# Patient Record
Sex: Male | Born: 1955 | Race: White | Hispanic: No | Marital: Married | Smoking: Former smoker
Health system: Southern US, Community
[De-identification: ages and names within clinical notes are randomized; demographics above are authoritative.]

## PROBLEM LIST (undated history)

## (undated) DIAGNOSIS — M1990 Unspecified osteoarthritis, unspecified site: Secondary | ICD-10-CM

## (undated) DIAGNOSIS — D1779 Benign lipomatous neoplasm of other sites: Secondary | ICD-10-CM

## (undated) DIAGNOSIS — J8410 Pulmonary fibrosis, unspecified: Secondary | ICD-10-CM

## (undated) DIAGNOSIS — T8859XA Other complications of anesthesia, initial encounter: Secondary | ICD-10-CM

## (undated) DIAGNOSIS — R7303 Prediabetes: Secondary | ICD-10-CM

## (undated) DIAGNOSIS — T4145XA Adverse effect of unspecified anesthetic, initial encounter: Secondary | ICD-10-CM

## (undated) DIAGNOSIS — H409 Unspecified glaucoma: Secondary | ICD-10-CM

## (undated) DIAGNOSIS — J189 Pneumonia, unspecified organism: Secondary | ICD-10-CM

## (undated) DIAGNOSIS — K219 Gastro-esophageal reflux disease without esophagitis: Secondary | ICD-10-CM

## (undated) DIAGNOSIS — I10 Essential (primary) hypertension: Secondary | ICD-10-CM

## (undated) HISTORY — PX: BACK SURGERY: SHX140

## (undated) HISTORY — DX: Pulmonary fibrosis, unspecified: J84.10

## (undated) HISTORY — PX: CARPAL TUNNEL RELEASE: SHX101

## (undated) HISTORY — DX: Gastro-esophageal reflux disease without esophagitis: K21.9

## (undated) HISTORY — DX: Benign lipomatous neoplasm of other sites: D17.79

## (undated) HISTORY — DX: Unspecified glaucoma: H40.9

---

## 2001-11-25 ENCOUNTER — Ambulatory Visit (HOSPITAL_COMMUNITY): Admission: RE | Admit: 2001-11-25 | Discharge: 2001-11-25 | Payer: Self-pay | Admitting: Cardiovascular Disease

## 2004-06-16 ENCOUNTER — Ambulatory Visit: Payer: Self-pay | Admitting: Family Medicine

## 2016-01-30 HISTORY — PX: KNEE ARTHROPLASTY: SHX992

## 2016-04-06 ENCOUNTER — Other Ambulatory Visit: Payer: Self-pay | Admitting: Neurological Surgery

## 2016-04-19 ENCOUNTER — Encounter (HOSPITAL_COMMUNITY)
Admission: RE | Admit: 2016-04-19 | Discharge: 2016-04-19 | Disposition: A | Discharge status: Home / Self Care | Payer: BC Managed Care – PPO | Source: Ambulatory Visit | Attending: Neurological Surgery | Admitting: Neurological Surgery

## 2016-04-19 ENCOUNTER — Encounter (HOSPITAL_COMMUNITY): Payer: Self-pay

## 2016-04-19 DIAGNOSIS — I10 Essential (primary) hypertension: Secondary | ICD-10-CM | POA: Insufficient documentation

## 2016-04-19 DIAGNOSIS — Z01812 Encounter for preprocedural laboratory examination: Secondary | ICD-10-CM | POA: Insufficient documentation

## 2016-04-19 DIAGNOSIS — Z6831 Body mass index (BMI) 31.0-31.9, adult: Secondary | ICD-10-CM | POA: Diagnosis not present

## 2016-04-19 DIAGNOSIS — M1990 Unspecified osteoarthritis, unspecified site: Secondary | ICD-10-CM | POA: Diagnosis not present

## 2016-04-19 DIAGNOSIS — R7303 Prediabetes: Secondary | ICD-10-CM | POA: Insufficient documentation

## 2016-04-19 DIAGNOSIS — E669 Obesity, unspecified: Secondary | ICD-10-CM | POA: Diagnosis not present

## 2016-04-19 DIAGNOSIS — Z87891 Personal history of nicotine dependence: Secondary | ICD-10-CM | POA: Diagnosis not present

## 2016-04-19 HISTORY — DX: Unspecified osteoarthritis, unspecified site: M19.90

## 2016-04-19 HISTORY — DX: Other complications of anesthesia, initial encounter: T88.59XA

## 2016-04-19 HISTORY — DX: Essential (primary) hypertension: I10

## 2016-04-19 HISTORY — DX: Prediabetes: R73.03

## 2016-04-19 HISTORY — DX: Pneumonia, unspecified organism: J18.9

## 2016-04-19 HISTORY — DX: Adverse effect of unspecified anesthetic, initial encounter: T41.45XA

## 2016-04-19 LAB — GLUCOSE, CAPILLARY: GLUCOSE-CAPILLARY: 73 mg/dL (ref 65–99)

## 2016-04-19 LAB — BASIC METABOLIC PANEL
Anion gap: 11 (ref 5–15)
BUN: 12 mg/dL (ref 6–20)
CALCIUM: 10.4 mg/dL — AB (ref 8.9–10.3)
CHLORIDE: 101 mmol/L (ref 101–111)
CO2: 25 mmol/L (ref 22–32)
CREATININE: 0.83 mg/dL (ref 0.61–1.24)
GFR calc non Af Amer: >60 mL/min (ref >60)
Glucose, Bld: 78 mg/dL (ref 65–99)
Potassium: 3.8 mmol/L (ref 3.5–5.1)
SODIUM: 137 mmol/L (ref 135–145)

## 2016-04-19 LAB — CBC
HCT: 41.3 % (ref 39.0–52.0)
Hemoglobin: 14.1 g/dL (ref 13.0–17.0)
MCH: 32.1 pg (ref 26.0–34.0)
MCHC: 34.1 g/dL (ref 30.0–36.0)
MCV: 94.1 fL (ref 78.0–100.0)
PLATELETS: 259 10*3/uL (ref 150–400)
RBC: 4.39 MIL/uL (ref 4.22–5.81)
RDW: 12.5 % (ref 11.5–15.5)
WBC: 12 10*3/uL — AB (ref 4.0–10.5)

## 2016-04-19 LAB — SURGICAL PCR SCREEN
MRSA, PCR: NEGATIVE
Staphylococcus aureus: NEGATIVE

## 2016-04-19 NOTE — Pre-Procedure Instructions (Addendum)
PHILBERT BAR  04/19/2016      [facility] - [place], [address] [place] [address] Phone: [phone] Fax: [phone]  [place], [place] [address] [address] Phone: [phone] Fax: [phone]    Your procedure is scheduled on 04/28/16.  Report to [facility] Admitting at 830 A.M.  Call this number if you have problems the morning of surgery:  [phone]   Remember:  Do not eat food or drink liquids after midnight.  Take these medicines the morning of surgery with A SIP OF WATER   Tylenol if needed, amlodipine(norvasc),atenolol(tenormin),  STOP all herbel meds, nsaids (aleve,naproxen,advil,ibuprofen) 7 days prior to surgery starting 04/22/16 including aspirin,all vitamins,supplements,gluscosamin-chondroitin,tumeric, goody powders, excedrin    Do not wear jewelry, make-up or nail polish.  Do not wear lotions, powders, or perfumes, or deoderant.  Do not shave 48 hours prior to surgery.  Men may shave face and neck.  Do not bring valuables to the hospital.  [facility] is not responsible for any belongings or valuables.  Contacts, dentures or bridgework may not be worn into surgery.  Leave your suitcase in the car.  After surgery it may be brought to your room.  For patients admitted to the hospital, discharge time will be determined by your treatment team.  Patients discharged the day of surgery will not be allowed to drive home.   Special instructions:   Special Instructions:  - Preparing for Surgery  Before surgery, you can play an important role.  Because skin is not sterile, your skin needs to be as free of germs as possible.  You can reduce the number of germs on you skin by washing with CHG (chlorahexidine gluconate) soap before surgery.  CHG is an antiseptic cleaner which kills germs and bonds with the skin to continue killing  germs even after washing.  Please DO NOT use if you have an allergy to CHG or antibacterial soaps.  If your skin becomes reddened/irritated stop using the CHG and inform your nurse when you arrive at Short Stay.  Do not shave (including legs and underarms) for at least 48 hours prior to the first CHG shower.  You may shave your face.  Please follow these instructions carefully:   1.  Shower with CHG Soap the night before surgery and the morning of Surgery.  2.  If you choose to wash your hair, wash your hair first as usual with your normal shampoo.  3.  After you shampoo, rinse your hair and body thoroughly to remove the Shampoo.  4.  Use CHG as you would any other liquid soap.  You can apply chg directly  to the skin and wash gently with scrungie or a clean washcloth.  5.  Apply the CHG Soap to your body ONLY FROM THE NECK DOWN.  Do not use on open wounds or open sores.  Avoid contact with your eyes ears, mouth and genitals (private parts).  Wash genitals (private parts)       with your normal soap.  6.  Wash thoroughly, paying special attention to the area where your surgery will be performed.  7.  Thoroughly rinse your body with warm water from the neck down.  8.  DO NOT shower/wash with your normal soap after using and rinsing off the CHG Soap.  9.  Pat yourself dry with a clean towel.  10.  Wear clean pajamas.            11.  Place clean sheets on your bed the night of your first shower and do not sleep with pets.  Day of Surgery  Do not apply any lotions/deodorants the morning of surgery.  Please wear clean clothes to the hospital/surgery center.  Please read over the  fact sheets that you were given.

## 2016-04-20 LAB — HEMOGLOBIN A1C
Hgb A1c MFr Bld: 5.2 % (ref 4.8–5.6)
Mean Plasma Glucose: 103 mg/dL

## 2016-04-20 NOTE — Progress Notes (Addendum)
Anesthesia chart review: Patient is a 60 year old male posted for L2-S1 laminectomy on 04/28/2016 by Dr. Cyndy Freeze.  History includes former smoker, HTN, pre-diabetes, arthritis, back surgery, right TKA 01/2016. For anesthesia history, he reports hard to awaken and hypotension. BMI is consistent with obesity.   PCP is listed as Dr. Wende Neighbors. He did an EKG on 12/15/15 as part of patient's pre-operative clearance for right TKA which patient had in October. Based on EKG findings (see below and tracing on patient's paper chart), he initially thought patient would need to see cardiology, but then he was able to get old records from [facility]. His addendum included, "Alex Berry was contacted by myself because I did find an old EKG from his previous Cardiolite stress done for nonspecific ST changes. His EKG was negative other than what was thought to be diaphragmatic attenuation, but the patient is asymptomatic and clearly was not interested in cardiac consult. The patient understands there is a significant risk to having general anesthesia and knee replacement surgery, but he has no clear-cut indication for any cardiac workup and at this point chooses to decline any sort of cardiology consult or cardiac workup and I'm going to go ahead and clear him for surgery with information I have available to me at this time."  Meds include amlodipine, aspirin, atenolol, lisinopril, turmeric, lysine.  BP (!) 142/83   Pulse 67   Temp 36.8 C (Oral)   Resp 20   Ht 6' 3.5" (1.918 m)   Wt 255 lb (115.7 kg)   SpO2 100%   BMI 31.45 kg/m   12/15/15 EKG (Dr. Micheal Likens): SR with first degree AV block, LAD, incomplete right BBB with LAFB, possible septal infarct (age undetermined).  10/30/01 Nuclear stress test [facility]): Impression: 1. Cardiolite images do not reveal evidence of ischemia or scar. There appears to be soft tissue/diaphragmatic attenuation over the inferior wall. 2. Gated study is normal with an  ejection fraction of 76%. 3. Abnormal EKG stress test with approximately 1.75 mm inferior and lateral ST segment depression. 4. No chest pain developed. 5. Frequent PVCs and couplets were seen by peak exercise and occasional PVCs were seen in the immediate recovery in occasional PACs were seen during the recovery. 6. Suggest clinical correlation.  Preoperative labs noted. A1c 5.2.   Discussed above with anesthesiologist Dr. Smith Robert. If patient is overall active without CV symptoms then it is anticipated that he can proceed as planned. I will attempt to contact him between now and surgery.  Alex Berry [facility] Short Stay Center/Anesthesiology Phone [phone] 04/20/2016 7:00 PM  Addendum: I spoke with patient. He denied chest pain, SOB, palpitations, syncope. He has been doing well since his right TKA, but unfortunately now with back issues that are causing his LLE to feel numb after prolonged standing. He is still able to be active with day to day activities, walking with errands, etc. He was just released this week for the out-patient rehab clinic where he was able to tolerate ~ 1 hour sessions of physical thearpy. Other than his back/LLE concerns, he denied any new issues since he was last seen by Dr. Micheal Likens and had his knee replacement (he believes was done with nerve block and spinal). If no acute changes then I anticipate that he can proceed as planned.  Alex Berry [facility] Short Stay Center/Anesthesiology Phone [phone] 04/21/2016 2:24 PM

## 2016-04-28 ENCOUNTER — Inpatient Hospital Stay (HOSPITAL_COMMUNITY)
Admission: RE | Admit: 2016-04-28 | Discharge: 2016-04-29 | DRG: 517 | Disposition: A | Discharge status: Home / Self Care | Payer: BC Managed Care – PPO | Source: Ambulatory Visit | Attending: Neurological Surgery | Admitting: Neurological Surgery

## 2016-04-28 ENCOUNTER — Inpatient Hospital Stay (HOSPITAL_COMMUNITY): Payer: BC Managed Care – PPO | Admitting: Vascular Surgery

## 2016-04-28 ENCOUNTER — Inpatient Hospital Stay (HOSPITAL_COMMUNITY): Payer: BC Managed Care – PPO

## 2016-04-28 ENCOUNTER — Inpatient Hospital Stay (HOSPITAL_COMMUNITY): Payer: BC Managed Care – PPO | Admitting: Anesthesiology

## 2016-04-28 ENCOUNTER — Encounter (HOSPITAL_COMMUNITY)
Admission: RE | Disposition: A | Discharge status: Home / Self Care | Payer: Self-pay | Source: Ambulatory Visit | Attending: Neurological Surgery

## 2016-04-28 ENCOUNTER — Encounter (HOSPITAL_COMMUNITY): Payer: Self-pay | Admitting: *Deleted

## 2016-04-28 DIAGNOSIS — I10 Essential (primary) hypertension: Secondary | ICD-10-CM | POA: Diagnosis present

## 2016-04-28 DIAGNOSIS — Z87891 Personal history of nicotine dependence: Secondary | ICD-10-CM | POA: Diagnosis not present

## 2016-04-28 DIAGNOSIS — M1990 Unspecified osteoarthritis, unspecified site: Secondary | ICD-10-CM | POA: Diagnosis present

## 2016-04-28 DIAGNOSIS — Z79899 Other long term (current) drug therapy: Secondary | ICD-10-CM

## 2016-04-28 DIAGNOSIS — Z96651 Presence of right artificial knee joint: Secondary | ICD-10-CM | POA: Diagnosis present

## 2016-04-28 DIAGNOSIS — R200 Anesthesia of skin: Secondary | ICD-10-CM | POA: Diagnosis present

## 2016-04-28 DIAGNOSIS — Z419 Encounter for procedure for purposes other than remedying health state, unspecified: Secondary | ICD-10-CM

## 2016-04-28 DIAGNOSIS — Z7982 Long term (current) use of aspirin: Secondary | ICD-10-CM

## 2016-04-28 DIAGNOSIS — M4727 Other spondylosis with radiculopathy, lumbosacral region: Principal | ICD-10-CM | POA: Diagnosis present

## 2016-04-28 HISTORY — PX: LUMBAR LAMINECTOMY/DECOMPRESSION MICRODISCECTOMY: SHX5026

## 2016-04-28 LAB — GLUCOSE, CAPILLARY
GLUCOSE-CAPILLARY: 104 mg/dL — AB (ref 65–99)
Glucose-Capillary: 104 mg/dL — ABNORMAL HIGH (ref 65–99)

## 2016-04-28 SURGERY — LUMBAR LAMINECTOMY/DECOMPRESSION MICRODISCECTOMY 4 LEVEL
Anesthesia: General | Site: Back

## 2016-04-28 MED ORDER — CEFAZOLIN SODIUM-DEXTROSE 2-4 GM/100ML-% IV SOLN
INTRAVENOUS | Status: AC
  Filled 2016-04-28: qty 100

## 2016-04-28 MED ORDER — THROMBIN 20000 UNITS EX SOLR
CUTANEOUS | Status: AC
  Filled 2016-04-28: qty 20000

## 2016-04-28 MED ORDER — THROMBIN 5000 UNITS EX SOLR
OROMUCOSAL | Status: DC | PRN
  Administered 2016-04-28: 12:00:00 via TOPICAL

## 2016-04-28 MED ORDER — LISINOPRIL 20 MG PO TABS
40.0000 mg | ORAL_TABLET | Freq: Every evening | ORAL | Status: DC
  Administered 2016-04-28: 40 mg via ORAL
  Filled 2016-04-28 (×2): qty 2

## 2016-04-28 MED ORDER — FENTANYL CITRATE (PF) 100 MCG/2ML IJ SOLN
INTRAMUSCULAR | Status: AC
  Filled 2016-04-28: qty 4

## 2016-04-28 MED ORDER — DEXAMETHASONE SODIUM PHOSPHATE 10 MG/ML IJ SOLN
INTRAMUSCULAR | Status: DC | PRN
  Administered 2016-04-28: 10 mg via INTRAVENOUS

## 2016-04-28 MED ORDER — LYSINE 500 MG PO CAPS: 500.0000 mg | ORAL_CAPSULE | Freq: Every day | ORAL | Status: DC

## 2016-04-28 MED ORDER — PHENYLEPHRINE HCL 10 MG/ML IJ SOLN
INTRAMUSCULAR | Status: DC | PRN
  Administered 2016-04-28: 80 ug via INTRAVENOUS

## 2016-04-28 MED ORDER — SODIUM CHLORIDE 0.9% FLUSH: 3.0000 mL | INTRAVENOUS | Status: DC | PRN

## 2016-04-28 MED ORDER — SODIUM CHLORIDE 0.9 % IV SOLN: 250.0000 mL | INTRAVENOUS | Status: DC

## 2016-04-28 MED ORDER — SODIUM CHLORIDE 0.9% FLUSH: 3.0000 mL | Freq: Two times a day (BID) | INTRAVENOUS | Status: DC

## 2016-04-28 MED ORDER — LIDOCAINE-EPINEPHRINE (PF) 2 %-1:200000 IJ SOLN
INTRAMUSCULAR | Status: DC | PRN
  Administered 2016-04-28: 15 mL

## 2016-04-28 MED ORDER — THROMBIN 5000 UNITS EX SOLR
CUTANEOUS | Status: AC
  Filled 2016-04-28: qty 5000

## 2016-04-28 MED ORDER — ONDANSETRON HCL 4 MG/2ML IJ SOLN
INTRAMUSCULAR | Status: DC | PRN
  Administered 2016-04-28: 4 mg via INTRAVENOUS

## 2016-04-28 MED ORDER — SUGAMMADEX SODIUM 200 MG/2ML IV SOLN
INTRAVENOUS | Status: DC | PRN
  Administered 2016-04-28: 200 mg via INTRAVENOUS

## 2016-04-28 MED ORDER — CHLORHEXIDINE GLUCONATE CLOTH 2 % EX PADS: 6.0000 | MEDICATED_PAD | Freq: Once | CUTANEOUS | Status: DC

## 2016-04-28 MED ORDER — LIDOCAINE HCL (CARDIAC) 20 MG/ML IV SOLN
INTRAVENOUS | Status: DC | PRN
  Administered 2016-04-28: 40 mg via INTRAVENOUS
  Administered 2016-04-28: 60 mg via INTRAVENOUS

## 2016-04-28 MED ORDER — METOCLOPRAMIDE HCL 5 MG/ML IJ SOLN
INTRAMUSCULAR | Status: AC
  Filled 2016-04-28: qty 2

## 2016-04-28 MED ORDER — LIDOCAINE-EPINEPHRINE (PF) 2 %-1:200000 IJ SOLN
INTRAMUSCULAR | Status: AC
  Filled 2016-04-28: qty 20

## 2016-04-28 MED ORDER — CEFAZOLIN SODIUM-DEXTROSE 2-4 GM/100ML-% IV SOLN
2.0000 g | INTRAVENOUS | Status: AC
  Administered 2016-04-28: 2 g via INTRAVENOUS

## 2016-04-28 MED ORDER — ONDANSETRON HCL 4 MG/2ML IJ SOLN
INTRAMUSCULAR | Status: AC
  Filled 2016-04-28: qty 2

## 2016-04-28 MED ORDER — METOCLOPRAMIDE HCL 5 MG/ML IJ SOLN
INTRAMUSCULAR | Status: DC | PRN
  Administered 2016-04-28: 10 mg via INTRAVENOUS

## 2016-04-28 MED ORDER — DEXAMETHASONE SODIUM PHOSPHATE 10 MG/ML IJ SOLN
INTRAMUSCULAR | Status: AC
  Filled 2016-04-28: qty 1

## 2016-04-28 MED ORDER — MIDAZOLAM HCL 2 MG/2ML IJ SOLN
INTRAMUSCULAR | Status: AC
  Filled 2016-04-28: qty 2

## 2016-04-28 MED ORDER — SENNA 8.6 MG PO TABS
1.0000 | ORAL_TABLET | Freq: Two times a day (BID) | ORAL | Status: DC
  Administered 2016-04-28 – 2016-04-29 (×3): 8.6 mg via ORAL
  Filled 2016-04-28 (×3): qty 1

## 2016-04-28 MED ORDER — AMLODIPINE BESYLATE 10 MG PO TABS
10.0000 mg | ORAL_TABLET | Freq: Every day | ORAL | Status: DC
  Filled 2016-04-28: qty 1

## 2016-04-28 MED ORDER — BUPIVACAINE LIPOSOME 1.3 % IJ SUSP
20.0000 mL | INTRAMUSCULAR | Status: AC
  Administered 2016-04-28: 20 mL
  Filled 2016-04-28: qty 20

## 2016-04-28 MED ORDER — ONDANSETRON HCL 4 MG/2ML IJ SOLN: 4.0000 mg | Freq: Four times a day (QID) | INTRAMUSCULAR | Status: DC | PRN

## 2016-04-28 MED ORDER — MIDAZOLAM HCL 5 MG/5ML IJ SOLN
INTRAMUSCULAR | Status: DC | PRN
  Administered 2016-04-28: 2 mg via INTRAVENOUS

## 2016-04-28 MED ORDER — ADULT MULTIVITAMIN W/MINERALS CH
1.0000 | ORAL_TABLET | Freq: Every day | ORAL | Status: DC
  Administered 2016-04-28: 1 via ORAL
  Filled 2016-04-28 (×2): qty 1

## 2016-04-28 MED ORDER — EPHEDRINE SULFATE 50 MG/ML IJ SOLN
INTRAMUSCULAR | Status: DC | PRN
  Administered 2016-04-28 (×2): 5 mg via INTRAVENOUS

## 2016-04-28 MED ORDER — PROPOFOL 10 MG/ML IV BOLUS
INTRAVENOUS | Status: AC
  Filled 2016-04-28: qty 20

## 2016-04-28 MED ORDER — METHOCARBAMOL 750 MG PO TABS
750.0000 mg | ORAL_TABLET | Freq: Four times a day (QID) | ORAL | Status: DC
  Administered 2016-04-28 – 2016-04-29 (×3): 750 mg via ORAL
  Filled 2016-04-28 (×3): qty 1

## 2016-04-28 MED ORDER — BUPIVACAINE HCL (PF) 0.5 % IJ SOLN
INTRAMUSCULAR | Status: DC | PRN
  Administered 2016-04-28: 15 mL

## 2016-04-28 MED ORDER — THROMBIN 20000 UNITS EX SOLR
CUTANEOUS | Status: DC | PRN
  Administered 2016-04-28: 12:00:00 via TOPICAL

## 2016-04-28 MED ORDER — OXYCODONE HCL ER 20 MG PO T12A
20.0000 mg | EXTENDED_RELEASE_TABLET | Freq: Two times a day (BID) | ORAL | Status: DC
  Administered 2016-04-28: 20 mg via ORAL
  Filled 2016-04-28: qty 1

## 2016-04-28 MED ORDER — BUPIVACAINE HCL (PF) 0.25 % IJ SOLN
INTRAMUSCULAR | Status: AC
  Filled 2016-04-28: qty 30

## 2016-04-28 MED ORDER — METHYLPREDNISOLONE ACETATE 80 MG/ML IJ SUSP
INTRAMUSCULAR | Status: AC
  Filled 2016-04-28: qty 1

## 2016-04-28 MED ORDER — DEXTROSE 5 % IV SOLN
INTRAVENOUS | Status: DC | PRN
  Administered 2016-04-28: 10:00:00 via INTRAVENOUS

## 2016-04-28 MED ORDER — SODIUM CHLORIDE 0.9 % IJ SOLN
INTRAMUSCULAR | Status: AC
  Filled 2016-04-28: qty 20

## 2016-04-28 MED ORDER — LACTATED RINGERS IV SOLN
INTRAVENOUS | Status: DC
  Administered 2016-04-28: 09:00:00 via INTRAVENOUS

## 2016-04-28 MED ORDER — CELECOXIB 200 MG PO CAPS
200.0000 mg | ORAL_CAPSULE | Freq: Two times a day (BID) | ORAL | Status: DC
  Administered 2016-04-28 – 2016-04-29 (×2): 200 mg via ORAL
  Filled 2016-04-28 (×2): qty 1

## 2016-04-28 MED ORDER — ALUM & MAG HYDROXIDE-SIMETH 200-200-20 MG/5ML PO SUSP: 30.0000 mL | Freq: Four times a day (QID) | ORAL | Status: DC | PRN

## 2016-04-28 MED ORDER — ONDANSETRON HCL 4 MG/2ML IJ SOLN: 4.0000 mg | INTRAMUSCULAR | Status: DC | PRN

## 2016-04-28 MED ORDER — LACTATED RINGERS IV SOLN
INTRAVENOUS | Status: DC | PRN
  Administered 2016-04-28 (×2): via INTRAVENOUS

## 2016-04-28 MED ORDER — OXYCODONE HCL 5 MG/5ML PO SOLN: 5.0000 mg | Freq: Once | ORAL | Status: DC | PRN

## 2016-04-28 MED ORDER — VECURONIUM BROMIDE 10 MG IV SOLR
INTRAVENOUS | Status: DC | PRN
  Administered 2016-04-28: 3 mg via INTRAVENOUS
  Administered 2016-04-28: 2 mg via INTRAVENOUS

## 2016-04-28 MED ORDER — SUGAMMADEX SODIUM 200 MG/2ML IV SOLN
INTRAVENOUS | Status: AC
  Filled 2016-04-28: qty 2

## 2016-04-28 MED ORDER — KETOROLAC TROMETHAMINE 30 MG/ML IJ SOLN
INTRAMUSCULAR | Status: AC
  Filled 2016-04-28: qty 1

## 2016-04-28 MED ORDER — TURMERIC CURCUMIN 500 MG PO CAPS: 500.0000 mg | ORAL_CAPSULE | Freq: Every day | ORAL | Status: DC

## 2016-04-28 MED ORDER — MENTHOL 3 MG MT LOZG: 1.0000 | LOZENGE | OROMUCOSAL | Status: DC | PRN

## 2016-04-28 MED ORDER — CEFAZOLIN IN D5W 1 GM/50ML IV SOLN
1.0000 g | Freq: Three times a day (TID) | INTRAVENOUS | Status: AC
  Administered 2016-04-28 – 2016-04-29 (×2): 1 g via INTRAVENOUS
  Filled 2016-04-28 (×2): qty 50

## 2016-04-28 MED ORDER — ROCURONIUM BROMIDE 100 MG/10ML IV SOLN
INTRAVENOUS | Status: DC | PRN
  Administered 2016-04-28: 50 mg via INTRAVENOUS

## 2016-04-28 MED ORDER — OXYCODONE HCL 5 MG PO TABS
5.0000 mg | ORAL_TABLET | ORAL | Status: DC | PRN
  Administered 2016-04-28 – 2016-04-29 (×5): 10 mg via ORAL
  Filled 2016-04-28 (×5): qty 2

## 2016-04-28 MED ORDER — ASPIRIN 325 MG PO TABS
325.0000 mg | ORAL_TABLET | Freq: Every day | ORAL | Status: DC
  Filled 2016-04-28: qty 1

## 2016-04-28 MED ORDER — ALBUMIN HUMAN 5 % IV SOLN
INTRAVENOUS | Status: DC | PRN
  Administered 2016-04-28 (×2): via INTRAVENOUS

## 2016-04-28 MED ORDER — GELATIN ABSORBABLE MT POWD
OROMUCOSAL | Status: DC | PRN
  Administered 2016-04-28 (×2): via TOPICAL

## 2016-04-28 MED ORDER — PANTOPRAZOLE SODIUM 40 MG PO TBEC
40.0000 mg | DELAYED_RELEASE_TABLET | Freq: Every day | ORAL | Status: DC
  Administered 2016-04-28: 40 mg via ORAL
  Filled 2016-04-28: qty 1

## 2016-04-28 MED ORDER — 0.9 % SODIUM CHLORIDE (POUR BTL) OPTIME
TOPICAL | Status: DC | PRN
  Administered 2016-04-28: 1000 mL

## 2016-04-28 MED ORDER — PHENOL 1.4 % MT LIQD: 1.0000 | OROMUCOSAL | Status: DC | PRN

## 2016-04-28 MED ORDER — ACETAMINOPHEN 325 MG PO TABS: 650.0000 mg | ORAL_TABLET | Freq: Four times a day (QID) | ORAL | Status: DC | PRN

## 2016-04-28 MED ORDER — SODIUM CHLORIDE 0.9 % IR SOLN
Status: DC | PRN
  Administered 2016-04-28: 12:00:00

## 2016-04-28 MED ORDER — GLYCOPYRROLATE 0.2 MG/ML IJ SOLN
INTRAMUSCULAR | Status: DC | PRN
  Administered 2016-04-28: 0.2 mg via INTRAVENOUS
  Administered 2016-04-28: 0.1 mg via INTRAVENOUS

## 2016-04-28 MED ORDER — BUPIVACAINE HCL (PF) 0.5 % IJ SOLN
INTRAMUSCULAR | Status: AC
  Filled 2016-04-28: qty 30

## 2016-04-28 MED ORDER — ATENOLOL 25 MG PO TABS
25.0000 mg | ORAL_TABLET | Freq: Every day | ORAL | Status: DC
  Filled 2016-04-28: qty 1

## 2016-04-28 MED ORDER — PROPOFOL 10 MG/ML IV BOLUS
INTRAVENOUS | Status: DC | PRN
  Administered 2016-04-28: 170 mg via INTRAVENOUS

## 2016-04-28 MED ORDER — VANCOMYCIN HCL 1000 MG IV SOLR
INTRAVENOUS | Status: AC
  Filled 2016-04-28: qty 1000

## 2016-04-28 MED ORDER — DOCUSATE SODIUM 100 MG PO CAPS
100.0000 mg | ORAL_CAPSULE | Freq: Two times a day (BID) | ORAL | Status: DC
  Administered 2016-04-28 – 2016-04-29 (×3): 100 mg via ORAL
  Filled 2016-04-28 (×3): qty 1

## 2016-04-28 MED ORDER — THROMBIN 5000 UNITS EX SOLR
CUTANEOUS | Status: AC
  Filled 2016-04-28: qty 10000

## 2016-04-28 MED ORDER — VANCOMYCIN HCL 1000 MG IV SOLR
INTRAVENOUS | Status: DC | PRN
  Administered 2016-04-28: 1000 mg via TOPICAL

## 2016-04-28 MED ORDER — VECURONIUM BROMIDE 10 MG IV SOLR
INTRAVENOUS | Status: AC
  Filled 2016-04-28: qty 10

## 2016-04-28 MED ORDER — SODIUM CHLORIDE 0.9 % IJ SOLN
INTRAMUSCULAR | Status: DC | PRN
  Administered 2016-04-28 (×2): 10 mL

## 2016-04-28 MED ORDER — POTASSIUM CHLORIDE IN NACL 20-0.9 MEQ/L-% IV SOLN: 100.0000 mL/h | INTRAVENOUS | Status: DC

## 2016-04-28 MED ORDER — FLEET ENEMA 7-19 GM/118ML RE ENEM: 1.0000 | ENEMA | Freq: Once | RECTAL | Status: DC | PRN

## 2016-04-28 MED ORDER — ACETAMINOPHEN 500 MG PO TABS
1000.0000 mg | ORAL_TABLET | Freq: Four times a day (QID) | ORAL | Status: DC
  Administered 2016-04-28 – 2016-04-29 (×3): 1000 mg via ORAL
  Filled 2016-04-28 (×3): qty 2

## 2016-04-28 MED ORDER — ARTIFICIAL TEARS OP OINT
TOPICAL_OINTMENT | OPHTHALMIC | Status: DC | PRN
  Administered 2016-04-28: 1 via OPHTHALMIC

## 2016-04-28 MED ORDER — PHENYLEPHRINE HCL 10 MG/ML IJ SOLN
INTRAVENOUS | Status: DC | PRN
  Administered 2016-04-28: 25 ug/min via INTRAVENOUS

## 2016-04-28 MED ORDER — FENTANYL CITRATE (PF) 100 MCG/2ML IJ SOLN
INTRAMUSCULAR | Status: DC | PRN
  Administered 2016-04-28 (×4): 50 ug via INTRAVENOUS

## 2016-04-28 MED ORDER — PANTOPRAZOLE SODIUM 40 MG IV SOLR: 40.0000 mg | Freq: Every day | INTRAVENOUS | Status: DC

## 2016-04-28 MED ORDER — DOCUSATE SODIUM 100 MG PO CAPS: 100.0000 mg | ORAL_CAPSULE | Freq: Every day | ORAL | Status: DC

## 2016-04-28 MED ORDER — BISACODYL 10 MG RE SUPP: 10.0000 mg | Freq: Every day | RECTAL | Status: DC | PRN

## 2016-04-28 MED ORDER — OXYCODONE HCL 5 MG PO TABS: 5.0000 mg | ORAL_TABLET | Freq: Once | ORAL | Status: DC | PRN

## 2016-04-28 MED ORDER — GABAPENTIN 300 MG PO CAPS
300.0000 mg | ORAL_CAPSULE | Freq: Three times a day (TID) | ORAL | Status: DC
  Administered 2016-04-28 – 2016-04-29 (×3): 300 mg via ORAL
  Filled 2016-04-28 (×3): qty 1

## 2016-04-28 MED ORDER — HYDROMORPHONE HCL 1 MG/ML IJ SOLN: 0.2500 mg | INTRAMUSCULAR | Status: DC | PRN

## 2016-04-28 SURGICAL SUPPLY — 76 items
ADH SKN CLS APL DERMABOND .7 (GAUZE/BANDAGES/DRESSINGS) ×1
APL SKNCLS STERI-STRIP NONHPOA (GAUZE/BANDAGES/DRESSINGS)
BAG DECANTER FOR FLEXI CONT (MISCELLANEOUS) ×3 IMPLANT
BENZOIN TINCTURE PRP APPL 2/3 (GAUZE/BANDAGES/DRESSINGS) IMPLANT
BIT DRILL NEURO 2X3.1 SFT TUCH (MISCELLANEOUS) ×1 IMPLANT
BLADE CLIPPER SURG (BLADE) ×2 IMPLANT
BLADE SURG 11 STRL SS (BLADE) ×1 IMPLANT
BUR ROUND FLUTED 5 RND (BURR) ×2 IMPLANT
BUR ROUND FLUTED 5MM RND (BURR) ×1
CANISTER SUCT 3000ML PPV (MISCELLANEOUS) ×4 IMPLANT
CARTRIDGE OIL MAESTRO DRILL (MISCELLANEOUS) ×1 IMPLANT
CHLORAPREP W/TINT 26ML (MISCELLANEOUS) ×3 IMPLANT
CLOSURE WOUND 1/2 X4 (GAUZE/BANDAGES/DRESSINGS)
CONT SPEC 4OZ CLIKSEAL STRL BL (MISCELLANEOUS) ×3 IMPLANT
DECANTER SPIKE VIAL GLASS SM (MISCELLANEOUS) ×3 IMPLANT
DERMABOND ADVANCED (GAUZE/BANDAGES/DRESSINGS) ×2
DERMABOND ADVANCED .7 DNX12 (GAUZE/BANDAGES/DRESSINGS) ×1 IMPLANT
DIFFUSER DRILL AIR PNEUMATIC (MISCELLANEOUS) ×3 IMPLANT
DRAPE MICROSCOPE LEICA (MISCELLANEOUS) ×1 IMPLANT
DRAPE POUCH INSTRU U-SHP 10X18 (DRAPES) ×3 IMPLANT
DRAPE SURG 17X23 STRL (DRAPES) ×3 IMPLANT
DRILL NEURO 2X3.1 SOFT TOUCH (MISCELLANEOUS)
DRSG OPSITE 4X5.5 SM (GAUZE/BANDAGES/DRESSINGS) ×2 IMPLANT
DRSG OPSITE POSTOP 4X10 (GAUZE/BANDAGES/DRESSINGS) ×2 IMPLANT
ELECT REM PT RETURN 9FT ADLT (ELECTROSURGICAL) ×3
ELECTRODE REM PT RTRN 9FT ADLT (ELECTROSURGICAL) ×1 IMPLANT
EVACUATOR 1/8 PVC DRAIN (DRAIN) ×2 IMPLANT
GAUZE SPONGE 4X4 12PLY STRL (GAUZE/BANDAGES/DRESSINGS) IMPLANT
GAUZE SPONGE 4X4 16PLY XRAY LF (GAUZE/BANDAGES/DRESSINGS) IMPLANT
GLOVE BIOGEL PI IND STRL 7.5 (GLOVE) ×1 IMPLANT
GLOVE BIOGEL PI IND STRL 8 (GLOVE) IMPLANT
GLOVE BIOGEL PI IND STRL 8.5 (GLOVE) IMPLANT
GLOVE BIOGEL PI INDICATOR 7.5 (GLOVE) ×2
GLOVE BIOGEL PI INDICATOR 8 (GLOVE) ×6
GLOVE BIOGEL PI INDICATOR 8.5 (GLOVE) ×2
GLOVE ECLIPSE 7.5 STRL STRAW (GLOVE) ×6 IMPLANT
GLOVE ECLIPSE 8.5 STRL (GLOVE) ×2 IMPLANT
GLOVE SS BIOGEL STRL SZ 7.5 (GLOVE) ×2 IMPLANT
GLOVE SUPERSENSE BIOGEL SZ 7.5 (GLOVE) ×4
GOWN STRL REUS W/ TWL LRG LVL3 (GOWN DISPOSABLE) ×1 IMPLANT
GOWN STRL REUS W/ TWL XL LVL3 (GOWN DISPOSABLE) IMPLANT
GOWN STRL REUS W/TWL 2XL LVL3 (GOWN DISPOSABLE) ×2 IMPLANT
GOWN STRL REUS W/TWL LRG LVL3 (GOWN DISPOSABLE) ×3
GOWN STRL REUS W/TWL XL LVL3 (GOWN DISPOSABLE) ×6
HEMOSTAT POWDER KIT SURGIFOAM (HEMOSTASIS) ×5 IMPLANT
KIT BASIN OR (CUSTOM PROCEDURE TRAY) ×3 IMPLANT
KIT ROOM TURNOVER OR (KITS) ×3 IMPLANT
NDL HYPO 18GX1.5 BLUNT FILL (NEEDLE) ×1 IMPLANT
NDL HYPO 21X1.5 SAFETY (NEEDLE) ×2 IMPLANT
NDL HYPO 25X1 1.5 SAFETY (NEEDLE) ×1 IMPLANT
NEEDLE HYPO 18GX1.5 BLUNT FILL (NEEDLE) ×3 IMPLANT
NEEDLE HYPO 21X1.5 SAFETY (NEEDLE) ×6 IMPLANT
NEEDLE HYPO 25X1 1.5 SAFETY (NEEDLE) ×3 IMPLANT
NS IRRIG 1000ML POUR BTL (IV SOLUTION) ×3 IMPLANT
OIL CARTRIDGE MAESTRO DRILL (MISCELLANEOUS) ×3
PACK LAMINECTOMY NEURO (CUSTOM PROCEDURE TRAY) ×3 IMPLANT
PACK UNIVERSAL I (CUSTOM PROCEDURE TRAY) ×3 IMPLANT
PAD ARMBOARD 7.5X6 YLW CONV (MISCELLANEOUS) ×9 IMPLANT
PATTIES SURGICAL .5X1.5 (GAUZE/BANDAGES/DRESSINGS) ×1 IMPLANT
RUBBERBAND STERILE (MISCELLANEOUS) ×2 IMPLANT
SPONGE SURGIFOAM ABS GEL 100 (HEMOSTASIS) ×3 IMPLANT
STRIP CLOSURE SKIN 1/2X4 (GAUZE/BANDAGES/DRESSINGS) IMPLANT
SUT STRATAFIX 1PDS 45CM VIOLET (SUTURE) ×2 IMPLANT
SUT STRATAFIX MNCRL+ 3-0 PS-2 (SUTURE) ×1
SUT STRATAFIX MONOCRYL 3-0 (SUTURE) ×2
SUT STRATAFIX SPIRAL + 2-0 (SUTURE) ×2 IMPLANT
SUT VIC AB 0 CT1 18XCR BRD8 (SUTURE) ×2 IMPLANT
SUT VIC AB 0 CT1 8-18 (SUTURE) ×6
SUT VIC AB 2-0 CT1 18 (SUTURE) ×2 IMPLANT
SUT VIC AB 4-0 PS2 27 (SUTURE) IMPLANT
SUTURE STRATFX MNCRL+ 3-0 PS-2 (SUTURE) IMPLANT
SYR 30ML LL (SYRINGE) ×6 IMPLANT
SYR 5ML LL (SYRINGE) ×3 IMPLANT
TOWEL OR 17X24 6PK STRL BLUE (TOWEL DISPOSABLE) ×5 IMPLANT
TOWEL OR 17X26 10 PK STRL BLUE (TOWEL DISPOSABLE) ×3 IMPLANT
WATER STERILE IRR 1000ML POUR (IV SOLUTION) ×3 IMPLANT

## 2016-04-28 NOTE — Anesthesia Preprocedure Evaluation (Signed)
Anesthesia Evaluation  Patient identified by MRN, date of birth, ID band Patient awake    Reviewed: Allergy & Precautions, H&P , NPO status , Patient's Chart, lab work & pertinent test results  Airway Mallampati: II   Neck ROM: full    Dental   Pulmonary former smoker,    breath sounds clear to auscultation       Cardiovascular hypertension,  Rhythm:regular Rate:Normal     Neuro/Psych    GI/Hepatic   Endo/Other    Renal/GU      Musculoskeletal  (+) Arthritis ,   Abdominal   Peds  Hematology   Anesthesia Other Findings   Reproductive/Obstetrics                             Anesthesia Physical Anesthesia Plan  ASA: II  Anesthesia Plan: General   Post-op Pain Management:    Induction: Intravenous  Airway Management Planned: Oral ETT  Additional Equipment:   Intra-op Plan:   Post-operative Plan: Extubation in OR  Informed Consent: I have reviewed the patients History and Physical, chart, labs and discussed the procedure including the risks, benefits and alternatives for the proposed anesthesia with the patient or authorized representative who has indicated his/her understanding and acceptance.     Plan Discussed with: CRNA, Anesthesiologist and Surgeon  Anesthesia Plan Comments:         Anesthesia Quick Evaluation

## 2016-04-28 NOTE — H&P (Signed)
CC:  No chief complaint on file.   HPI: Alex Berry is a 60 y.o. male who presents for elective lumbar decompression.  He complains of numbness and paresthesias radiating into his left leg.  This has not improved with conservative management.  He has not had any changes since I saw him in clinic last.  PMH: Past Medical History:  Diagnosis Date  . Arthritis   . Complication of anesthesia    hard to awaken-blood pressure dropped  . Hypertension   . Pneumonia    hx child  . Pre-diabetes     PSH: Past Surgical History:  Procedure Laterality Date  . BACK SURGERY     30 yrs  . CARPAL TUNNEL RELEASE Left    30 yrs  . KNEE ARTHROPLASTY Right 01/2016    SH: Social History  Substance Use Topics  . Smoking status: Former Smoker    Packs/day: 1.50    Years: 15.00    Quit date: 04/19/1996  . Smokeless tobacco: Never Used  . Alcohol use No    MEDS: Prior to Admission medications   Medication Sig Start Date End Date Taking? Authorizing Provider  acetaminophen (TYLENOL) 650 MG CR tablet Take 1,300 mg by mouth every 8 (eight) hours as needed for pain.   Yes Historical Provider, MD  amLODipine (NORVASC) 10 MG tablet Take 10 mg by mouth daily. 02/28/16  Yes Historical Provider, MD  APPLE CIDER VINEGAR PO Take 15 mLs by mouth 3 (three) times a week.   Yes Historical Provider, MD  aspirin EC 81 MG tablet Take 81 mg by mouth daily.   Yes Historical Provider, MD  atenolol (TENORMIN) 25 MG tablet Take 25 mg by mouth daily. 02/28/16  Yes Historical Provider, MD  docusate sodium (COLACE) 100 MG capsule Take 100 mg by mouth daily.   Yes Historical Provider, MD  lisinopril (PRINIVIL,ZESTRIL) 40 MG tablet Take 40 mg by mouth every evening. 02/28/16  Yes Historical Provider, MD  Lysine 500 MG CAPS Take 500 mg by mouth daily.   Yes Historical Provider, MD  Misc Natural Products (GLUCOSAMINE CHOND COMPLEX/MSM PO) Take 1 tablet by mouth daily.   Yes Historical Provider, MD  Multiple  Vitamin (MULTIVITAMIN WITH MINERALS) TABS tablet Take 1 tablet by mouth daily.   Yes Historical Provider, MD  Turmeric Curcumin 500 MG CAPS Take 500 mg by mouth daily.   Yes Historical Provider, MD  acetaminophen (TYLENOL) 325 MG tablet Take 650 mg by mouth every 6 (six) hours as needed (for pain.).    Historical Provider, MD  aspirin 325 MG tablet Take 325 mg by mouth daily.    Historical Provider, MD    ALLERGY: Allergies  Allergen Reactions  . No Known Allergies     ROS: ROS  NEUROLOGIC EXAM: Awake, alert, oriented Memory and concentration grossly intact Speech fluent, appropriate CN grossly intact Motor exam: Upper Extremities Deltoid Bicep Tricep Grip  Right 5/5 5/5 5/5 5/5  Left 5/5 5/5 5/5 5/5   Lower Extremity IP Quad PF DF EHL  Right 5/5 5/5 5/5 5/5 5/5  Left 5/5 5/5 5/5 5/5 5/5   Sensation grossly intact to LT  IMAGING: No new imaging.  IMPRESSION: - 60 y.o. male with lumbosacral spondylosis and radiculopathy.  PLAN: - L2-S1 laminectomy for decompression - We have had a long discussion about the risks and benefits of surgery as well as the alternatives.  He understands and wishes to proceed.

## 2016-04-28 NOTE — Anesthesia Postprocedure Evaluation (Signed)
Anesthesia Post Note  Patient: Alex Berry  Procedure(s) Performed: Procedure(s) (LRB): Lumbar two-Sacral one Laminectomy (N/A)  Patient location during evaluation: PACU Anesthesia Type: General Level of consciousness: awake and alert and patient cooperative Pain management: pain level controlled Vital Signs Assessment: post-procedure vital signs reviewed and stable Respiratory status: spontaneous breathing and respiratory function stable Cardiovascular status: stable Anesthetic complications: no       Last Vitals:  Vitals:   04/28/16 1340 04/28/16 1345  BP: 110/66   Pulse: 77 64  Resp: 18 13  Temp:  36.4 C    Last Pain:  Vitals:   04/28/16 0846  TempSrc: Oral    LLE Motor Response: Purposeful movement;Responds to commands (04/28/16 1340) LLE Sensation: Full sensation (04/28/16 1340) RLE Motor Response: Purposeful movement;Responds to commands (04/28/16 1340) RLE Sensation: Full sensation (04/28/16 1340)      [place]

## 2016-04-28 NOTE — Anesthesia Procedure Notes (Signed)
Procedure Name: Intubation Date/Time: 04/28/2016 10:36 AM Performed by: Jacquiline Doe A Pre-anesthesia Checklist: Patient identified, Emergency Drugs available, Suction available and Patient being monitored Patient Re-evaluated:Patient Re-evaluated prior to inductionOxygen Delivery Method: Circle System Utilized and Circle system utilized Preoxygenation: Pre-oxygenation with 100% oxygen Intubation Type: IV induction and Cricoid Pressure applied Ventilation: Mask ventilation without difficulty Laryngoscope Size: Mac and 4 Grade View: Grade II Tube type: Oral Tube size: 7.5 mm Number of attempts: 1 Airway Equipment and Method: Stylet and Oral airway Placement Confirmation: ETT inserted through vocal cords under direct vision,  positive ETCO2 and breath sounds checked- equal and bilateral Secured at: 24 cm Tube secured with: Tape Dental Injury: Teeth and Oropharynx as per pre-operative assessment

## 2016-04-28 NOTE — Progress Notes (Signed)
Physical Therapy Evaluation Patient Details Name: Alex Berry MRN: [id number] DOB: 16-Feb-1956 Today's Date: 04/28/2016   History of Present Illness  Pt is a 60 y.o. male who presents for elective lumbar decompression.  He complains of numbness and paresthesias radiating into his left leg. PMH of back surgery, R knee arthroplasty (01/2016), HTN, and pre-diabetes   Clinical Impression  PTA, pt was independent with ADLs and community mobility with an assistive device. Pt currently lives with wife and son who will be able to provide 24/7 supervision as needed. Pt able to ambulate with min guard today for safety. Pt demonstrates good compliance of his back precautions and was able to safely transfer from supine to sit and sit to stand. Pt is a good candidate for HHPT to address deficits in mobility and strength to allow return to baseline function. Pt apprehensive about attempting stairs today, will try again tomorrow. Daughter reports that apprehension is 2/2 recent R knee surgery. PT will continue to follow acutely.    Follow Up Recommendations Home health PT    Equipment Recommendations  None recommended by PT    Recommendations for Other Services       Precautions / Restrictions Precautions Precautions: Back Precaution Booklet Issued: Yes (comment) Precaution Comments: reviewed back precautions Restrictions Weight Bearing Restrictions: No      Mobility  Bed Mobility Overal bed mobility: Needs Assistance Bed Mobility: Rolling;Sidelying to Sit Rolling: Supervision Sidelying to sit: Min assist       General bed mobility comments: Min assist for LE mobility. VC for sequencing and hand placement  Transfers Overall transfer level: Needs assistance Equipment used: Rolling walker (2 wheeled) Transfers: Sit to/from Stand Sit to Stand: Min guard         General transfer comment: Min guard for safety. Pt reports that he has hx of almost fainting after  TKA.  Ambulation/Gait Ambulation/Gait assistance: Min guard Ambulation Distance (Feet): 75 Feet Assistive device: Rolling walker (2 wheeled) Gait Pattern/deviations: Step-through pattern;Trunk flexed;Decreased stride length Gait velocity: decreased Gait velocity interpretation: Below normal speed for age/gender General Gait Details: Slow, steady gait with trunk flexed. Pt responded well for VCs to upright trunk.   Stairs            Wheelchair Mobility    Modified Rankin (Stroke Patients Only)       Balance Overall balance assessment: Needs assistance Sitting-balance support: No upper extremity supported;Feet supported Sitting balance-Leahy Scale: Good     Standing balance support: No upper extremity supported Standing balance-Leahy Scale: Fair Standing balance comment: Pt able to stand statically without UE support. Pt reliant on RW with ambulation                             Pertinent Vitals/Pain Pain Assessment: 0-10 Pain Score: 2  Pain Location: back Pain Descriptors / Indicators: Aching Pain Intervention(s): Limited activity within patient's tolerance;Monitored during session;Repositioned    Home Living Family/patient expects to be discharged to:: Private residence Living Arrangements: Spouse/significant other;Children (adult son) Available Help at Discharge: Family Type of Home: House Home Access: Stairs to enter Entrance Stairs-Rails: None Entrance Stairs-Number of Steps: 2 Home Layout: One level Home Equipment: Environmental consultant - 2 wheels;Shower seat;Bedside commode      Prior Function Level of Independence: Independent         Comments: Pt had TKA in 01/2016 but has been recovering well. Was ambulating without assistive device prior to this hospital admission  Hand Dominance        Extremity/Trunk Assessment   Upper Extremity Assessment Upper Extremity Assessment: Overall WFL for tasks assessed    Lower Extremity Assessment Lower  Extremity Assessment: Overall WFL for tasks assessed       Communication   Communication: No difficulties  Cognition Arousal/Alertness: Awake/alert Behavior During Therapy: WFL for tasks assessed/performed Overall Cognitive Status: Within Functional Limits for tasks assessed                      General Comments      Exercises     Assessment/Plan    PT Assessment Patient needs continued PT services  PT Problem List Decreased strength;Decreased range of motion;Decreased balance;Decreased activity tolerance;Decreased mobility;Decreased knowledge of use of DME;Decreased safety awareness;Pain;Decreased knowledge of precautions          PT Treatment Interventions DME instruction;Stair training;Gait training;Functional mobility training;Therapeutic activities;Therapeutic exercise;Balance training;Patient/family education    PT Goals (Current goals can be found in the Care Plan section)  Acute Rehab PT Goals Patient Stated Goal: to go home PT Goal Formulation: With patient Time For Goal Achievement: 05/12/16 Potential to Achieve Goals: Good    Frequency Min 5X/week   Barriers to discharge        Co-evaluation               End of Session Equipment Utilized During Treatment: Gait belt Activity Tolerance: Patient tolerated treatment well Patient left: in chair;with call bell/phone within reach;with family/visitor present Nurse Communication: Mobility status         Time: [id number] PT Time Calculation (min) (ACUTE ONLY): 25 min   Charges:   PT Evaluation $PT Eval Moderate Complexity: 1 Procedure PT Treatments $Gait Training: 8-22 mins   PT G Codes:        Tonia Brooms 15-May-2016, 4:09 PM Tonia Brooms, SPT [phone]

## 2016-04-28 NOTE — Evaluation (Signed)
Occupational Therapy Evaluation and Discharge Patient Details Name: Alex Berry MRN: [id number] DOB: 11-28-1955 Today's Date: 04/28/2016    History of Present Illness Pt is a 60 y.o. male s/p L2-S1 Laminectomy. PMHx: HTN, Pre-diabetes, Hx back sx, R TKA.    Clinical Impression   Pt reports he was independent with ADL PTA. Currently pt overall supervision for ADL and functional mobility with the exception of min assist for LB ADL. All back, safety, and ADL education completed with pt; he has no further questions or concerns for OT at this time. Pt planning to d/c home with 24/7 supervision from family. No further acute OT needs identified; signing off at this time. Please re-consult if needs change. Thank you for this referral.    Follow Up Recommendations  No OT follow up;Supervision/Assistance - 24 hour (initially)    Equipment Recommendations  None recommended by OT    Recommendations for Other Services       Precautions / Restrictions Precautions Precautions: Back Precaution Booklet Issued: No (already provided by PT) Precaution Comments: Pt able to recall 2/3 back precautions; 3/3 with prompting. Restrictions Weight Bearing Restrictions: No      Mobility Bed Mobility Overal bed mobility: Needs Assistance Bed Mobility: Rolling;Sit to Sidelying Rolling: Supervision      Sit to sidelying: Min assist General bed mobility comments: Assist for LEs back into bed. Cues for log roll technique. HOB flat without use of bed rail.  Transfers Overall transfer level: Needs assistance Equipment used: Rolling walker (2 wheeled) Transfers: Sit to/from Stand Sit to Stand: Supervision         General transfer comment: Supervision for safety. VCs for hand placement, pt with good technique.    Balance Overall balance assessment: Needs assistance Sitting-balance support: Feet supported;No upper extremity supported Sitting balance-Leahy Scale: Good     Standing  balance support: No upper extremity supported;During functional activity Standing balance-Leahy Scale: Fair Standing balance comment: Able to stand at sink and wash hands without UE support                            ADL Overall ADL's : Needs assistance/impaired Eating/Feeding: Independent;Sitting   Grooming: Supervision/safety;Standing;Wash/dry hands Grooming Details (indicate cue type and reason): Educated pt on use of 2 cups for oral care Upper Body Bathing: Set up;Sitting   Lower Body Bathing: Supervison/ safety;Sit to/from stand   Upper Body Dressing : Set up;Sitting   Lower Body Dressing: Minimal assistance;Sit to/from stand Lower Body Dressing Details (indicate cue type and reason): Pt reports wife will assist with LB ADL as needed. Educated on compensatory strategies for LB ADL. Toilet Transfer: Supervision/safety;Ambulation;BSC;RW     Toileting - Clothing Manipulation Details (indicate cue type and reason): Educated on peri care technique to avoid twsiting and use of wet wipes Tub/ Shower Transfer: Supervision/safety;Walk-in shower;Ambulation;Shower Technical sales engineer Details (indicate cue type and reason): Educated on use of shower seat for safety with bathing and supervision initially for safety. Functional mobility during ADLs: Supervision/safety;Rolling walker General ADL Comments: Educated pt on maintaining back precautions during functional activities, log roll technique for bed mobility, and frequently mobility throughout the day upon return home.     Vision Vision Assessment?: No apparent visual deficits   Perception     Praxis      Pertinent Vitals/Pain Pain Assessment: Faces Pain Score: 2  Faces Pain Scale: Hurts a little bit Pain Location: back Pain Descriptors / Indicators: Sore Pain  Intervention(s): Monitored during session;Repositioned     Hand Dominance     Extremity/Trunk Assessment Upper Extremity  Assessment Upper Extremity Assessment: Overall WFL for tasks assessed   Lower Extremity Assessment Lower Extremity Assessment: Defer to PT evaluation   Cervical / Trunk Assessment Cervical / Trunk Assessment: Other exceptions Cervical / Trunk Exceptions: s/p spinal sx   Communication Communication Communication: No difficulties   Cognition Arousal/Alertness: Awake/alert Behavior During Therapy: WFL for tasks assessed/performed Overall Cognitive Status: Within Functional Limits for tasks assessed                     General Comments       Exercises       Shoulder Instructions      Home Living Family/patient expects to be discharged to:: Private residence Living Arrangements: Spouse/significant other;Children Available Help at Discharge: Family;Available 24 hours/day Type of Home: House Home Access: Stairs to enter [employer] of Steps: 2 Entrance Stairs-Rails: None Home Layout: One level     Bathroom Shower/Tub: Occupational psychologist: Handicapped height     Home Equipment: Environmental consultant - 2 wheels;Bedside commode;Shower seat - built in          Prior Functioning/Environment Level of Independence: Independent        Comments: Pt had TKA in 01/2016 but has been recovering well. Was ambulating without assistive device prior to this hospital admission        OT Problem List:     OT Treatment/Interventions:      OT Goals(Current goals can be found in the care plan section) Acute Rehab OT Goals Patient Stated Goal: to go home OT Goal Formulation: All assessment and education complete, DC therapy  OT Frequency:     Barriers to D/C:            Co-evaluation              End of Session Equipment Utilized During Treatment: Rolling walker Nurse Communication: Mobility status;Other (comment) (no f/u or equipment needs)  Activity Tolerance: Patient tolerated treatment well Patient left: in bed;with call bell/phone within  reach   Time: [id number] OT Time Calculation (min): 14 min Charges:  OT General Charges $OT Visit: 1 Procedure OT Evaluation $OT Eval Moderate Complexity: 1 Procedure G-Codes:     Binnie Kand M.S., OTR/L Pager: [phone]  04/28/2016, 4:39 PM

## 2016-04-28 NOTE — Transfer of Care (Signed)
Immediate Anesthesia Transfer of Care Note  Patient: Alex Berry  Procedure(s) Performed: Procedure(s): Lumbar two-Sacral one Laminectomy (N/A)  Patient Location: PACU  Anesthesia Type:General  Level of Consciousness: awake, oriented, sedated, patient cooperative and responds to stimulation  Airway & Oxygen Therapy: Patient Spontanous Breathing and Patient connected to nasal cannula oxygen  Post-op Assessment: Report given to RN, Post -op Vital signs reviewed and stable, Patient moving all extremities and Patient moving all extremities X 4  Post vital signs: Reviewed and stable  Last Vitals:  Vitals:   04/28/16 0846  BP: 140/70  Pulse: 73  Resp: 20  Temp: 37.3 C    Last Pain:  Vitals:   04/28/16 0846  TempSrc: Oral      Patients Stated Pain Goal: 4 ([id number])  Complications: No apparent anesthesia complications

## 2016-04-28 NOTE — Op Note (Signed)
04/28/2016  12:47 PM  PATIENT:  Alex Berry  60 y.o. male  PRE-OPERATIVE DIAGNOSIS:  Lumbosacral spondylosis with radiculopathy  POST-OPERATIVE DIAGNOSIS:  Same  PROCEDURE:  L2-S1 laminectomy, reoperative left L4-5 and L5-S1 laminectomy  SURGEON:  Aldean Ast, MD  ASSISTANTS: Kristeen Miss, MD  ANESTHESIA:   General  DRAINS: Medium Hemovac   SPECIMEN:  None  INDICATION FOR PROCEDURE: 60 year old male with lumbar radiculopathy. Patient understood the risks, benefits, and alternatives and potential outcomes and wished to proceed.  PROCEDURE DETAILS: After smooth induction of general endotracheal anesthesia the patient was turned prone on the operating table on a Wilson frame. The skin of the lumbar region was clipped of hair. It was wiped down with alcohol. The patient was then prepped and draped in usual sterile fashion.   The subcutaneous tissues of the midline from approximately L2 to S1 was infiltrated with Marcaine. The skin in this area was opened sharply. The soft tissues were dissected with monopolar cautery. Subperiosteal dissection was carried out along the sides of the spinous processes and the laminar surfaces to the medial edge of the facet joints from L2 to S1. The spinous processes were removed with rongeurs. The laminae were then thinned with a high-speed bur. The remaining lamina was resected with a Kerrison punch. Thickened ligamentum flavum was separated from the dura and resected with a Kerrison rongeur. I avoided the left L4-5 and L5-S1 re-operative site until I had exposed the thecal sac elsewhere.  Using a straight curette I was able to free the L4, L5, and S1 nerve roots from the scar.  The thecal sac was further freed from the hypertrophied ligament in the lateral recesses.  Lateral recess decompression was completed. Decompression was carried out laterally to the foramina. The foramina were palpated and found to be without residual stenosis.   I  irrigated vigorously with bacitracin saline. There was excellent hemostasis. A medium hemovac drain was placed below the fascia. I placed vancomycin powder in the depths of the wounds.The wound was then closed in routine anatomic layers with running stratafix suture. I injected exparel in the paraspinous muscles. The skin was closed with a running Monocryl strata fix suture. It was then sealed with Dermabond. The patient was turned to the supine position and allowed to awaken from anesthesia.   PATIENT DISPOSITION:  PACU - hemodynamically stable.   Delay start of Pharmacological VTE agent (>24hrs) due to surgical blood loss or risk of bleeding:  yes

## 2016-04-29 ENCOUNTER — Encounter (HOSPITAL_COMMUNITY): Payer: Self-pay | Admitting: Neurological Surgery

## 2016-04-29 MED ORDER — OXYCODONE HCL 5 MG PO TABS: 5.0000 mg | ORAL_TABLET | ORAL | 0 refills | Status: DC | PRN

## 2016-04-29 NOTE — Progress Notes (Signed)
Physical Therapy Treatment Patient Details Name: Alex Berry MRN: [id number] DOB: 03/31/1956 Today's Date: 04/29/2016    History of Present Illness Pt is a 60 y.o. male s/p L2-S1 Laminectomy. PMHx: HTN, Pre-diabetes, Hx back sx, R TKA.     PT Comments    Patient progressing well with mobility. Tolerated stair training with supervision for safety. More confident with mobility today. Reports less pain. Pt has met most goals but will have supervision at home from wife. Able to recall and demonstrate 3/3 back precautions during session. All education complete. Encourage continued mobility while in hospital. Continue to recommend HHPT to maximize independence and mobility and decrease fall risk. Discharge from therapy.   Follow Up Recommendations  Home health PT;Supervision for mobility/OOB     Equipment Recommendations  None recommended by PT    Recommendations for Other Services       Precautions / Restrictions Precautions Precautions: Back Precaution Comments: Able to recall 3/3 back precautions. Restrictions Weight Bearing Restrictions: No    Mobility  Bed Mobility Overal bed mobility: Needs Assistance Bed Mobility: Rolling;Sidelying to Sit Rolling: Modified independent (Device/Increase time) Sidelying to sit: Modified independent (Device/Increase time)       General bed mobility comments: HOB flat, no use of rails to simulate home. Good demo for log roll technique.  Transfers Overall transfer level: Needs assistance Equipment used: Rolling walker (2 wheeled) Transfers: Sit to/from Stand Sit to Stand: Modified independent (Device/Increase time)         General transfer comment: Good technique. Transferred to chair post ambulation.  Ambulation/Gait Ambulation/Gait assistance: Supervision Ambulation Distance (Feet): 150 Feet Assistive device: Rolling walker (2 wheeled) Gait Pattern/deviations: Step-through pattern;Decreased stride length;Trunk flexed Gait  velocity: decreased Gait velocity interpretation: Below normal speed for age/gender General Gait Details: Slow, steady gait with trunk flexed. Pt responded well for VCs to upright trunk.    Stairs Stairs: Yes   Stair Management: Step to pattern;One rail Right Number of Stairs: 2 General stair comments: Cues for technique and safety.   Wheelchair Mobility    Modified Rankin (Stroke Patients Only)       Balance Overall balance assessment: Needs assistance Sitting-balance support: Feet supported;No upper extremity supported Sitting balance-Leahy Scale: Good     Standing balance support: During functional activity Standing balance-Leahy Scale: Fair                      Cognition Arousal/Alertness: Awake/alert Behavior During Therapy: WFL for tasks assessed/performed Overall Cognitive Status: Within Functional Limits for tasks assessed                      Exercises      General Comments        Pertinent Vitals/Pain Pain Assessment: 0-10 Pain Score: 2  Pain Location: back Pain Descriptors / Indicators: Sore Pain Intervention(s): Monitored during session;Repositioned    Home Living                      Prior Function            PT Goals (current goals can now be found in the care plan section) Progress towards PT goals: Goals met/education completed, patient discharged from PT    Frequency    Min 5X/week      PT Plan Current plan remains appropriate    Co-evaluation             End of Session Equipment Utilized During Treatment: Gait  belt Activity Tolerance: Patient tolerated treatment well Patient left: in chair;with call bell/phone within reach     Time: 0807-0824 PT Time Calculation (min) (ACUTE ONLY): 17 min  Charges:  $Gait Training: 8-22 mins                    G Codes:      Arturo Freundlich A Jeroline Wolbert 04/29/2016, 8:35 AM Wray Kearns, PT, DPT [phone]

## 2016-04-29 NOTE — Discharge Summary (Signed)
Physician Discharge Summary  Patient ID: Alex Berry MRN: [id number] DOB/AGE: Apr 17, 1956 60 y.o.  Admit date: 04/28/2016 Discharge date: 04/29/2016  Admission Diagnoses:  Discharge Diagnoses:  Active Problems:   Lumbosacral spondylosis with radiculopathy   Discharged Condition: good  Hospital Course: Patient admitted to the hospital where he underwent an calm K lumbar decompression. Postoperative use doing well. Preoperative back and lower extremity pain improved. Ambulating without difficulty. Ready for discharge home.  Consults:   Significant Diagnostic Studies:   Treatments:   Discharge Exam: Blood pressure 117/73, pulse 60, temperature 97.6 F (36.4 C), temperature source Oral, resp. rate 18, weight 115.7 kg (255 lb), SpO2 97 %.  Patient awake and alert. Oriented and appropriate. Kayla nerve function intact. Motor and sensory function extremities normal. Wound clean and dry. Chest and abdomen benign. Disposition:    Allergies as of 04/29/2016      Reactions   No Known Allergies       Medication List    TAKE these medications   acetaminophen 650 MG CR tablet Commonly known as:  TYLENOL Take 1,300 mg by mouth every 8 (eight) hours as needed for pain.   acetaminophen 325 MG tablet Commonly known as:  TYLENOL Take 650 mg by mouth every 6 (six) hours as needed (for pain.).   amLODipine 10 MG tablet Commonly known as:  NORVASC Take 10 mg by mouth daily.   APPLE CIDER VINEGAR PO Take 15 mLs by mouth 3 (three) times a week.   aspirin EC 81 MG tablet Take 81 mg by mouth daily.   aspirin 325 MG tablet Take 325 mg by mouth daily.   atenolol 25 MG tablet Commonly known as:  TENORMIN Take 25 mg by mouth daily.   docusate sodium 100 MG capsule Commonly known as:  COLACE Take 100 mg by mouth daily.   GLUCOSAMINE CHOND COMPLEX/MSM PO Take 1 tablet by mouth daily.   lisinopril 40 MG tablet Commonly known as:  PRINIVIL,ZESTRIL Take 40 mg by mouth  every evening.   Lysine 500 MG Caps Take 500 mg by mouth daily.   multivitamin with minerals Tabs tablet Take 1 tablet by mouth daily.   oxyCODONE 5 MG immediate release tablet Commonly known as:  Oxy IR/ROXICODONE Take 1-2 tablets (5-10 mg total) by mouth every 3 (three) hours as needed for breakthrough pain.   Turmeric Curcumin 500 MG Caps Take 500 mg by mouth daily.        Signed: Lyvonne Cassell A 04/29/2016, 8:39 AM

## 2016-04-29 NOTE — Progress Notes (Signed)
Pt doing well. Pt and wife given D/C instructions with Rx's, verbal understanding was provided. Pt's IV and Hemovac were removed prior to D/C. Pt's incision is clean and dry with no sign of infection. Pt D/C'd home via wheelchair @ 1035 per MD order. Pt is stable @ D/C and has no other needs at this time. Holli Humbles, RN

## 2016-05-29 ENCOUNTER — Inpatient Hospital Stay (HOSPITAL_COMMUNITY)
Admission: EM | Admit: 2016-05-29 | Discharge: 2016-06-01 | DRG: 029 | Disposition: A | Discharge status: Home / Self Care | Payer: BC Managed Care – PPO | Attending: Neurological Surgery | Admitting: Neurological Surgery

## 2016-05-29 ENCOUNTER — Emergency Department (HOSPITAL_COMMUNITY): Payer: BC Managed Care – PPO

## 2016-05-29 ENCOUNTER — Encounter (HOSPITAL_COMMUNITY): Payer: Self-pay

## 2016-05-29 ENCOUNTER — Inpatient Hospital Stay (HOSPITAL_COMMUNITY): Admit: 2016-05-29 | Payer: BC Managed Care – PPO | Source: Ambulatory Visit | Admitting: Neurological Surgery

## 2016-05-29 DIAGNOSIS — I10 Essential (primary) hypertension: Secondary | ICD-10-CM | POA: Diagnosis present

## 2016-05-29 DIAGNOSIS — Y839 Surgical procedure, unspecified as the cause of abnormal reaction of the patient, or of later complication, without mention of misadventure at the time of the procedure: Secondary | ICD-10-CM | POA: Diagnosis present

## 2016-05-29 DIAGNOSIS — G9782 Other postprocedural complications and disorders of nervous system: Secondary | ICD-10-CM | POA: Diagnosis present

## 2016-05-29 DIAGNOSIS — M1990 Unspecified osteoarthritis, unspecified site: Secondary | ICD-10-CM | POA: Diagnosis present

## 2016-05-29 DIAGNOSIS — Z96651 Presence of right artificial knee joint: Secondary | ICD-10-CM | POA: Diagnosis present

## 2016-05-29 DIAGNOSIS — G9600 Cerebrospinal fluid leak, unspecified: Secondary | ICD-10-CM | POA: Diagnosis present

## 2016-05-29 DIAGNOSIS — G9619 Other disorders of meninges, not elsewhere classified: Secondary | ICD-10-CM | POA: Diagnosis present

## 2016-05-29 DIAGNOSIS — Z7982 Long term (current) use of aspirin: Secondary | ICD-10-CM

## 2016-05-29 DIAGNOSIS — R7303 Prediabetes: Secondary | ICD-10-CM | POA: Diagnosis present

## 2016-05-29 DIAGNOSIS — R51 Headache: Secondary | ICD-10-CM | POA: Diagnosis present

## 2016-05-29 DIAGNOSIS — Z87891 Personal history of nicotine dependence: Secondary | ICD-10-CM | POA: Diagnosis not present

## 2016-05-29 DIAGNOSIS — Z79899 Other long term (current) drug therapy: Secondary | ICD-10-CM

## 2016-05-29 LAB — CBC WITH DIFFERENTIAL/PLATELET
BASOS ABS: 0.1 10*3/uL (ref 0.0–0.1)
BASOS PCT: 1 %
Eosinophils Absolute: 0.4 10*3/uL (ref 0.0–0.7)
Eosinophils Relative: 3 %
HCT: 40.8 % (ref 39.0–52.0)
HEMOGLOBIN: 13.8 g/dL (ref 13.0–17.0)
Lymphocytes Relative: 18 %
Lymphs Abs: 2.3 10*3/uL (ref 0.7–4.0)
MCH: 31.5 pg (ref 26.0–34.0)
MCHC: 33.8 g/dL (ref 30.0–36.0)
MCV: 93.2 fL (ref 78.0–100.0)
Monocytes Absolute: 0.6 10*3/uL (ref 0.1–1.0)
Monocytes Relative: 5 %
NEUTROS PCT: 73 %
Neutro Abs: 9.4 10*3/uL — ABNORMAL HIGH (ref 1.7–7.7)
PLATELETS: 262 10*3/uL (ref 150–400)
RBC: 4.38 MIL/uL (ref 4.22–5.81)
RDW: 12.4 % (ref 11.5–15.5)
WBC: 12.7 10*3/uL — ABNORMAL HIGH (ref 4.0–10.5)

## 2016-05-29 LAB — COMPREHENSIVE METABOLIC PANEL
ALBUMIN: 4.2 g/dL (ref 3.5–5.0)
ALT: 16 U/L — ABNORMAL LOW (ref 17–63)
ANION GAP: 16 — AB (ref 5–15)
AST: 27 U/L (ref 15–41)
Alkaline Phosphatase: 48 U/L (ref 38–126)
BUN: 16 mg/dL (ref 6–20)
CO2: 25 mmol/L (ref 22–32)
Calcium: 10.6 mg/dL — ABNORMAL HIGH (ref 8.9–10.3)
Chloride: 95 mmol/L — ABNORMAL LOW (ref 101–111)
Creatinine, Ser: 1.01 mg/dL (ref 0.61–1.24)
GFR calc Af Amer: >60 mL/min (ref >60)
GFR calc non Af Amer: >60 mL/min (ref >60)
GLUCOSE: 133 mg/dL — AB (ref 65–99)
POTASSIUM: 3.3 mmol/L — AB (ref 3.5–5.1)
SODIUM: 136 mmol/L (ref 135–145)
Total Bilirubin: 0.9 mg/dL (ref 0.3–1.2)
Total Protein: 8 g/dL (ref 6.5–8.1)

## 2016-05-29 LAB — I-STAT CG4 LACTIC ACID, ED: LACTIC ACID, VENOUS: 2.63 mmol/L — AB (ref 0.5–1.9)

## 2016-05-29 LAB — TYPE AND SCREEN
ABO/RH(D): A NEG
Antibody Screen: NEGATIVE

## 2016-05-29 LAB — ABO/RH: ABO/RH(D): A NEG

## 2016-05-29 LAB — PROTIME-INR
INR: 1.13
PROTHROMBIN TIME: 14.6 s (ref 11.4–15.2)

## 2016-05-29 LAB — APTT: aPTT: 27 seconds (ref 24–36)

## 2016-05-29 MED ORDER — POTASSIUM CHLORIDE IN NACL 20-0.9 MEQ/L-% IV SOLN
INTRAVENOUS | Status: DC
  Administered 2016-05-29 – 2016-06-01 (×3): via INTRAVENOUS
  Filled 2016-05-29 (×8): qty 1000

## 2016-05-29 MED ORDER — VANCOMYCIN HCL IN DEXTROSE 1-5 GM/200ML-% IV SOLN
1000.0000 mg | Freq: Two times a day (BID) | INTRAVENOUS | Status: DC
  Administered 2016-05-30 – 2016-06-01 (×4): 1000 mg via INTRAVENOUS
  Filled 2016-05-29 (×6): qty 200

## 2016-05-29 MED ORDER — ONDANSETRON 4 MG PO TBDP
4.0000 mg | ORAL_TABLET | Freq: Once | ORAL | Status: AC
  Administered 2016-05-29: 4 mg via ORAL

## 2016-05-29 MED ORDER — OXYCODONE HCL 5 MG PO TABS
5.0000 mg | ORAL_TABLET | ORAL | Status: DC | PRN
  Administered 2016-05-29: 10 mg via ORAL
  Administered 2016-05-30: 5 mg via ORAL
  Filled 2016-05-29: qty 2
  Filled 2016-05-29: qty 1

## 2016-05-29 MED ORDER — LYSINE 500 MG PO CAPS: 500.0000 mg | ORAL_CAPSULE | Freq: Every day | ORAL | Status: DC

## 2016-05-29 MED ORDER — LISINOPRIL 20 MG PO TABS
40.0000 mg | ORAL_TABLET | Freq: Every evening | ORAL | Status: DC
  Administered 2016-05-30 – 2016-05-31 (×2): 40 mg via ORAL
  Filled 2016-05-29 (×2): qty 2

## 2016-05-29 MED ORDER — DOCUSATE SODIUM 100 MG PO CAPS: 100.0000 mg | ORAL_CAPSULE | Freq: Every day | ORAL | Status: DC

## 2016-05-29 MED ORDER — ADULT MULTIVITAMIN W/MINERALS CH
1.0000 | ORAL_TABLET | Freq: Every day | ORAL | Status: DC
  Administered 2016-05-30 – 2016-06-01 (×3): 1 via ORAL
  Filled 2016-05-29 (×3): qty 1

## 2016-05-29 MED ORDER — CEFAZOLIN SODIUM-DEXTROSE 2-4 GM/100ML-% IV SOLN
2.0000 g | INTRAVENOUS | Status: DC
  Filled 2016-05-29 (×2): qty 100

## 2016-05-29 MED ORDER — ASPIRIN EC 81 MG PO TBEC
81.0000 mg | DELAYED_RELEASE_TABLET | Freq: Every day | ORAL | Status: DC
  Administered 2016-05-29 – 2016-06-01 (×4): 81 mg via ORAL
  Filled 2016-05-29 (×4): qty 1

## 2016-05-29 MED ORDER — ATENOLOL 25 MG PO TABS
25.0000 mg | ORAL_TABLET | Freq: Every day | ORAL | Status: DC
  Administered 2016-05-30 – 2016-06-01 (×3): 25 mg via ORAL
  Filled 2016-05-29 (×3): qty 1

## 2016-05-29 MED ORDER — ONDANSETRON 4 MG PO TBDP
ORAL_TABLET | ORAL | Status: AC
  Filled 2016-05-29: qty 1

## 2016-05-29 MED ORDER — TURMERIC CURCUMIN 500 MG PO CAPS: 500.0000 mg | ORAL_CAPSULE | Freq: Every day | ORAL | Status: DC

## 2016-05-29 MED ORDER — VANCOMYCIN HCL 10 G IV SOLR
2000.0000 mg | Freq: Once | INTRAVENOUS | Status: DC
  Filled 2016-05-29: qty 2000

## 2016-05-29 MED ORDER — AMLODIPINE BESYLATE 10 MG PO TABS
10.0000 mg | ORAL_TABLET | Freq: Every day | ORAL | Status: DC
  Administered 2016-05-30 – 2016-06-01 (×3): 10 mg via ORAL
  Filled 2016-05-29: qty 2
  Filled 2016-05-29 (×2): qty 1

## 2016-05-29 MED ORDER — ASPIRIN EC 81 MG PO TBEC: 81.0000 mg | DELAYED_RELEASE_TABLET | Freq: Every day | ORAL | Status: DC

## 2016-05-29 MED ORDER — CEFTRIAXONE SODIUM 2 G IJ SOLR
2.0000 g | Freq: Two times a day (BID) | INTRAMUSCULAR | Status: DC
  Administered 2016-05-30 – 2016-05-31 (×5): 2 g via INTRAVENOUS
  Filled 2016-05-29 (×10): qty 2

## 2016-05-29 NOTE — ED Notes (Signed)
Patient transported to X-ray 

## 2016-05-29 NOTE — ED Notes (Signed)
Spoke with Dr Cyndy Freeze [phone]  who stated pt was a direct admit to [facility] for wound exploration.  However, when pt arrived, pt did not have bed per bed control. Dr Cyndy Freeze then stated to admit pt to ED.

## 2016-05-29 NOTE — ED Triage Notes (Signed)
Patient here for possible spinal leak after having back surgery 1 month ago. Has had increased swelling to site and in route to surgeon had leak of fluid, now complains of severe headache. Alert and oriented, doctor told patient he had spinal leak and would need admission.

## 2016-05-29 NOTE — Progress Notes (Signed)
Patient arrived to the unit from the ED via stretcher; patient is alert and oriented; oriented to room and unit routine; patient cooperative.

## 2016-05-29 NOTE — ED Notes (Signed)
Gauze on incision changed. Gauze saturated with clear fluid. No redness or swelling noted.

## 2016-05-29 NOTE — Progress Notes (Signed)
Pharmacy Antibiotic Note  Alex Berry is a 62 y.o. male s/p back surgery 1 month ago now with CSF leak. Pharmacy has been consulted for vancomycin dosing. Renal function stable.  Vancomycin trough goal 15-20  Plan: 1) Vancomycin 2g IV x1 then 1g IV q12 2) Follow renal function, cultures, LOT, level if needed  Weight: 255 lb 1.2 oz (115.7 kg)  Temp (24hrs), Avg:98.1 F (36.7 C), Min:98.1 F (36.7 C), Max:98.1 F (36.7 C)   Recent Labs Lab 05/29/16 1557 05/29/16 1615  WBC 12.7*  --   CREATININE 1.01  --   LATICACIDVEN  --  2.63*    Estimated Creatinine Clearance: 104.8 mL/min (by C-G formula based on SCr of 1.01 mg/dL).    Allergies  Allergen Reactions  . No Known Allergies     Antimicrobials this admission: 1/29 Vancomycin >> 1/29 Ceftriaxone >>  Dose adjustments this admission: n/a  Microbiology results: n/a  Thank you for allowing pharmacy to be a part of this patient's care.  Deboraha Sprang 05/29/2016 5:02 PM

## 2016-05-29 NOTE — ED Notes (Signed)
Attempted report x1. 

## 2016-05-29 NOTE — ED Provider Notes (Signed)
Patient is currently being held in the emergency room. He is supposed to be a direct admission for neurosurgery, but they do not have a bed available for him on the floor at the time. He is status post L2-S1 laminectomy, reoperative left L4-5 and L5-S1 laminectomy at the end of December. He is apparently being admitted for wound exploration for CSF leak sometime tomorrow. I was asked by nursing and radiology technician to evaluate him because they reported pretty significant drainage from the wound requiring several bandages to be changed since they were saturated. On my exam he does have persistent oozing of clear fluid from the inferior aspect of the surgical wound. Patient denies any new back pain but he is complaining of worsening headache. He is declining pain medication though. Advised to lay flat. He has no acute neurological complaints otherwise. He is supposed to be going to his bed soon. I just want to make sure they're aware of his headache because he does have what I consider to be pretty significant drainage if this is indeed CSF.   6:08 PM Discussed with Dr Christella Noa. He is actually not on call, but pt's symptoms and my concerns were relayed to him.    Virgel Manifold, MD 05/29/16 [phone]

## 2016-05-29 NOTE — ED Notes (Signed)
I stat Lactic Acid results  (2.63 mmol/L) called and given to Dr. Wilson Singer by B. Yolanda Bonine, EMT

## 2016-05-30 ENCOUNTER — Encounter (HOSPITAL_COMMUNITY): Payer: Self-pay | Admitting: Surgery

## 2016-05-30 ENCOUNTER — Inpatient Hospital Stay (HOSPITAL_COMMUNITY): Payer: BC Managed Care – PPO | Admitting: Anesthesiology

## 2016-05-30 ENCOUNTER — Encounter (HOSPITAL_COMMUNITY)
Admission: EM | Disposition: A | Discharge status: Home / Self Care | Payer: Self-pay | Source: Home / Self Care | Attending: Neurological Surgery

## 2016-05-30 HISTORY — PX: LUMBAR WOUND DEBRIDEMENT: SHX1988

## 2016-05-30 LAB — URINALYSIS, ROUTINE W REFLEX MICROSCOPIC
BILIRUBIN URINE: NEGATIVE
GLUCOSE, UA: NEGATIVE mg/dL
Hgb urine dipstick: NEGATIVE
Ketones, ur: NEGATIVE mg/dL
Leukocytes, UA: NEGATIVE
NITRITE: NEGATIVE
PH: 7 (ref 5.0–8.0)
Protein, ur: NEGATIVE mg/dL
SPECIFIC GRAVITY, URINE: 1.005 (ref 1.005–1.030)

## 2016-05-30 SURGERY — LUMBAR WOUND DEBRIDEMENT
Anesthesia: General | Site: Back

## 2016-05-30 MED ORDER — FENTANYL CITRATE (PF) 100 MCG/2ML IJ SOLN
INTRAMUSCULAR | Status: DC | PRN
  Administered 2016-05-30 (×2): 50 ug via INTRAVENOUS
  Administered 2016-05-30: 100 ug via INTRAVENOUS

## 2016-05-30 MED ORDER — PANTOPRAZOLE SODIUM 40 MG IV SOLR
40.0000 mg | Freq: Every day | INTRAVENOUS | Status: DC
  Administered 2016-05-30 – 2016-05-31 (×2): 40 mg via INTRAVENOUS
  Filled 2016-05-30 (×2): qty 40

## 2016-05-30 MED ORDER — SODIUM CHLORIDE 0.9% FLUSH
3.0000 mL | Freq: Two times a day (BID) | INTRAVENOUS | Status: DC
  Administered 2016-05-30 – 2016-05-31 (×3): 3 mL via INTRAVENOUS

## 2016-05-30 MED ORDER — DOCUSATE SODIUM 100 MG PO CAPS
100.0000 mg | ORAL_CAPSULE | Freq: Two times a day (BID) | ORAL | Status: DC
  Administered 2016-05-30 – 2016-06-01 (×4): 100 mg via ORAL
  Filled 2016-05-30 (×4): qty 1

## 2016-05-30 MED ORDER — VANCOMYCIN HCL 1000 MG IV SOLR
INTRAVENOUS | Status: DC | PRN
  Administered 2016-05-30: 1000 mg via TOPICAL

## 2016-05-30 MED ORDER — BUPIVACAINE-EPINEPHRINE (PF) 0.5% -1:200000 IJ SOLN
INTRAMUSCULAR | Status: AC
  Filled 2016-05-30: qty 30

## 2016-05-30 MED ORDER — OXYCODONE HCL 5 MG/5ML PO SOLN: 5.0000 mg | Freq: Once | ORAL | Status: DC | PRN

## 2016-05-30 MED ORDER — ACETAMINOPHEN 500 MG PO TABS
1000.0000 mg | ORAL_TABLET | Freq: Four times a day (QID) | ORAL | Status: DC
  Administered 2016-05-30 – 2016-06-01 (×7): 1000 mg via ORAL
  Filled 2016-05-30 (×8): qty 2

## 2016-05-30 MED ORDER — MEPERIDINE HCL 25 MG/ML IJ SOLN: 6.2500 mg | INTRAMUSCULAR | Status: DC | PRN

## 2016-05-30 MED ORDER — LACTATED RINGERS IV SOLN
INTRAVENOUS | Status: DC | PRN
  Administered 2016-05-30 (×2): via INTRAVENOUS

## 2016-05-30 MED ORDER — OXYCODONE HCL 5 MG PO TABS: 5.0000 mg | ORAL_TABLET | Freq: Once | ORAL | Status: DC | PRN

## 2016-05-30 MED ORDER — CELECOXIB 200 MG PO CAPS
200.0000 mg | ORAL_CAPSULE | Freq: Two times a day (BID) | ORAL | Status: DC
  Administered 2016-05-30 – 2016-06-01 (×4): 200 mg via ORAL
  Filled 2016-05-30 (×4): qty 1

## 2016-05-30 MED ORDER — BUPIVACAINE-EPINEPHRINE (PF) 0.5% -1:200000 IJ SOLN
INTRAMUSCULAR | Status: DC | PRN
  Administered 2016-05-30: 15 mL

## 2016-05-30 MED ORDER — OXYCODONE HCL 5 MG PO TABS
5.0000 mg | ORAL_TABLET | ORAL | Status: DC | PRN
  Administered 2016-05-31 (×2): 10 mg via ORAL
  Filled 2016-05-30 (×2): qty 2

## 2016-05-30 MED ORDER — MIDAZOLAM HCL 2 MG/2ML IJ SOLN
INTRAMUSCULAR | Status: DC | PRN
  Administered 2016-05-30: 2 mg via INTRAVENOUS

## 2016-05-30 MED ORDER — PROPOFOL 10 MG/ML IV BOLUS
INTRAVENOUS | Status: AC
  Filled 2016-05-30: qty 20

## 2016-05-30 MED ORDER — CEFAZOLIN IN D5W 1 GM/50ML IV SOLN: 1.0000 g | Freq: Three times a day (TID) | INTRAVENOUS | Status: DC

## 2016-05-30 MED ORDER — SODIUM CHLORIDE 0.9 % IJ SOLN
INTRAMUSCULAR | Status: DC | PRN
  Administered 2016-05-30: 20 mL

## 2016-05-30 MED ORDER — GELATIN ABSORBABLE MT POWD
OROMUCOSAL | Status: DC | PRN
  Administered 2016-05-30: 15:00:00 via TOPICAL

## 2016-05-30 MED ORDER — BUPIVACAINE LIPOSOME 1.3 % IJ SUSP
20.0000 mL | INTRAMUSCULAR | Status: AC
  Administered 2016-05-30: 20 mL
  Filled 2016-05-30: qty 20

## 2016-05-30 MED ORDER — BUPIVACAINE HCL (PF) 0.25 % IJ SOLN
INTRAMUSCULAR | Status: AC
  Filled 2016-05-30: qty 30

## 2016-05-30 MED ORDER — LIDOCAINE 2% (20 MG/ML) 5 ML SYRINGE
INTRAMUSCULAR | Status: AC
  Filled 2016-05-30: qty 5

## 2016-05-30 MED ORDER — FLEET ENEMA 7-19 GM/118ML RE ENEM: 1.0000 | ENEMA | Freq: Once | RECTAL | Status: DC | PRN

## 2016-05-30 MED ORDER — FENTANYL CITRATE (PF) 100 MCG/2ML IJ SOLN
INTRAMUSCULAR | Status: AC
  Filled 2016-05-30: qty 4

## 2016-05-30 MED ORDER — GABAPENTIN 300 MG PO CAPS
300.0000 mg | ORAL_CAPSULE | Freq: Three times a day (TID) | ORAL | Status: DC
  Administered 2016-05-30 – 2016-06-01 (×5): 300 mg via ORAL
  Filled 2016-05-30 (×5): qty 1

## 2016-05-30 MED ORDER — SODIUM CHLORIDE 0.9 % IR SOLN
Status: DC | PRN
  Administered 2016-05-30: 15:00:00

## 2016-05-30 MED ORDER — 0.9 % SODIUM CHLORIDE (POUR BTL) OPTIME
TOPICAL | Status: DC | PRN
  Administered 2016-05-30: 1000 mL

## 2016-05-30 MED ORDER — EPHEDRINE SULFATE 50 MG/ML IJ SOLN
INTRAMUSCULAR | Status: DC | PRN
  Administered 2016-05-30: 10 mg via INTRAVENOUS

## 2016-05-30 MED ORDER — EPHEDRINE 5 MG/ML INJ
INTRAVENOUS | Status: AC
  Filled 2016-05-30: qty 10

## 2016-05-30 MED ORDER — ONDANSETRON HCL 4 MG/2ML IJ SOLN: 4.0000 mg | Freq: Once | INTRAMUSCULAR | Status: DC | PRN

## 2016-05-30 MED ORDER — METHYLPREDNISOLONE ACETATE 80 MG/ML IJ SUSP
INTRAMUSCULAR | Status: AC
  Filled 2016-05-30: qty 1

## 2016-05-30 MED ORDER — MENTHOL 3 MG MT LOZG: 1.0000 | LOZENGE | OROMUCOSAL | Status: DC | PRN

## 2016-05-30 MED ORDER — FENTANYL CITRATE (PF) 100 MCG/2ML IJ SOLN: 25.0000 ug | INTRAMUSCULAR | Status: DC | PRN

## 2016-05-30 MED ORDER — PROPOFOL 10 MG/ML IV BOLUS
INTRAVENOUS | Status: DC | PRN
  Administered 2016-05-30: 120 mg via INTRAVENOUS

## 2016-05-30 MED ORDER — LIDOCAINE HCL (CARDIAC) 20 MG/ML IV SOLN
INTRAVENOUS | Status: DC | PRN
  Administered 2016-05-30: 50 mg via INTRAVENOUS

## 2016-05-30 MED ORDER — ONDANSETRON HCL 4 MG/2ML IJ SOLN
INTRAMUSCULAR | Status: DC | PRN
  Administered 2016-05-30: 4 mg via INTRAVENOUS

## 2016-05-30 MED ORDER — HYDROMORPHONE HCL 1 MG/ML IJ SOLN: 0.2500 mg | INTRAMUSCULAR | Status: DC | PRN

## 2016-05-30 MED ORDER — SUGAMMADEX SODIUM 200 MG/2ML IV SOLN
INTRAVENOUS | Status: DC | PRN
  Administered 2016-05-30: 231.4 mg via INTRAVENOUS

## 2016-05-30 MED ORDER — CEFAZOLIN SODIUM-DEXTROSE 2-3 GM-% IV SOLR
INTRAVENOUS | Status: DC | PRN
  Administered 2016-05-30: 2 g via INTRAVENOUS

## 2016-05-30 MED ORDER — OXYCODONE HCL ER 10 MG PO T12A
20.0000 mg | EXTENDED_RELEASE_TABLET | Freq: Two times a day (BID) | ORAL | Status: DC
  Administered 2016-05-30 – 2016-06-01 (×4): 20 mg via ORAL
  Filled 2016-05-30 (×4): qty 2

## 2016-05-30 MED ORDER — KETOROLAC TROMETHAMINE 30 MG/ML IJ SOLN
INTRAMUSCULAR | Status: AC
  Filled 2016-05-30: qty 1

## 2016-05-30 MED ORDER — METHOCARBAMOL 750 MG PO TABS
750.0000 mg | ORAL_TABLET | Freq: Four times a day (QID) | ORAL | Status: DC
  Administered 2016-05-30 – 2016-06-01 (×6): 750 mg via ORAL
  Filled 2016-05-30 (×6): qty 1

## 2016-05-30 MED ORDER — LIDOCAINE-EPINEPHRINE (PF) 2 %-1:200000 IJ SOLN
INTRAMUSCULAR | Status: AC
  Filled 2016-05-30: qty 20

## 2016-05-30 MED ORDER — PHENYLEPHRINE 40 MCG/ML (10ML) SYRINGE FOR IV PUSH (FOR BLOOD PRESSURE SUPPORT)
PREFILLED_SYRINGE | INTRAVENOUS | Status: AC
  Filled 2016-05-30: qty 10

## 2016-05-30 MED ORDER — ROCURONIUM BROMIDE 100 MG/10ML IV SOLN
INTRAVENOUS | Status: DC | PRN
  Administered 2016-05-30: 50 mg via INTRAVENOUS

## 2016-05-30 MED ORDER — ROCURONIUM BROMIDE 50 MG/5ML IV SOSY
PREFILLED_SYRINGE | INTRAVENOUS | Status: AC
  Filled 2016-05-30: qty 5

## 2016-05-30 MED ORDER — ONDANSETRON HCL 4 MG/2ML IJ SOLN
4.0000 mg | INTRAMUSCULAR | Status: DC | PRN
  Administered 2016-05-31: 4 mg via INTRAVENOUS
  Filled 2016-05-30: qty 2

## 2016-05-30 MED ORDER — ALUM & MAG HYDROXIDE-SIMETH 200-200-20 MG/5ML PO SUSP: 30.0000 mL | Freq: Four times a day (QID) | ORAL | Status: DC | PRN

## 2016-05-30 MED ORDER — PHENOL 1.4 % MT LIQD: 1.0000 | OROMUCOSAL | Status: DC | PRN

## 2016-05-30 MED ORDER — LIDOCAINE-EPINEPHRINE (PF) 2 %-1:200000 IJ SOLN
INTRAMUSCULAR | Status: DC | PRN
  Administered 2016-05-30: 15 mL

## 2016-05-30 MED ORDER — SODIUM CHLORIDE 0.9 % IV SOLN: 250.0000 mL | INTRAVENOUS | Status: DC

## 2016-05-30 MED ORDER — POTASSIUM CHLORIDE IN NACL 20-0.9 MEQ/L-% IV SOLN: 100.0000 mL/h | INTRAVENOUS | Status: DC

## 2016-05-30 MED ORDER — THROMBIN 5000 UNITS EX SOLR
CUTANEOUS | Status: AC
  Filled 2016-05-30: qty 15000

## 2016-05-30 MED ORDER — THROMBIN 5000 UNITS EX SOLR
CUTANEOUS | Status: DC | PRN
  Administered 2016-05-30 (×2): 5000 [IU] via TOPICAL

## 2016-05-30 MED ORDER — SODIUM CHLORIDE 0.9% FLUSH: 3.0000 mL | INTRAVENOUS | Status: DC | PRN

## 2016-05-30 MED ORDER — VANCOMYCIN HCL 1000 MG IV SOLR
INTRAVENOUS | Status: AC
  Filled 2016-05-30: qty 1000

## 2016-05-30 MED ORDER — SODIUM CHLORIDE 0.9 % IJ SOLN
INTRAMUSCULAR | Status: AC
  Filled 2016-05-30: qty 20

## 2016-05-30 MED ORDER — ACETAZOLAMIDE 250 MG PO TABS
250.0000 mg | ORAL_TABLET | Freq: Two times a day (BID) | ORAL | Status: DC
  Administered 2016-05-30 – 2016-06-01 (×4): 250 mg via ORAL
  Filled 2016-05-30 (×5): qty 1

## 2016-05-30 MED ORDER — SENNA 8.6 MG PO TABS
1.0000 | ORAL_TABLET | Freq: Two times a day (BID) | ORAL | Status: DC
  Administered 2016-05-30 – 2016-06-01 (×4): 8.6 mg via ORAL
  Filled 2016-05-30 (×4): qty 1

## 2016-05-30 MED ORDER — BISACODYL 10 MG RE SUPP: 10.0000 mg | Freq: Every day | RECTAL | Status: DC | PRN

## 2016-05-30 MED ORDER — ONDANSETRON HCL 4 MG/2ML IJ SOLN
INTRAMUSCULAR | Status: AC
  Filled 2016-05-30: qty 2

## 2016-05-30 MED ORDER — HEMOSTATIC AGENTS (NO CHARGE) OPTIME
TOPICAL | Status: DC | PRN
  Administered 2016-05-30 (×2): 1 via TOPICAL

## 2016-05-30 SURGICAL SUPPLY — 75 items
ADH SKN CLS APL DERMABOND .7 (GAUZE/BANDAGES/DRESSINGS)
APL SKNCLS STERI-STRIP NONHPOA (GAUZE/BANDAGES/DRESSINGS)
BAG DECANTER FOR FLEXI CONT (MISCELLANEOUS) ×3 IMPLANT
BENZOIN TINCTURE PRP APPL 2/3 (GAUZE/BANDAGES/DRESSINGS) IMPLANT
BIT DRILL NEURO 2X3.1 SFT TUCH (MISCELLANEOUS) ×1 IMPLANT
BLADE CLIPPER SURG (BLADE) IMPLANT
BLADE SURG 11 STRL SS (BLADE) ×3 IMPLANT
BUR ROUND FLUTED 5 RND (BURR) ×1 IMPLANT
BUR ROUND FLUTED 5MM RND (BURR)
CANISTER SUCT 3000ML PPV (MISCELLANEOUS) ×4 IMPLANT
CARTRIDGE OIL MAESTRO DRILL (MISCELLANEOUS) ×1 IMPLANT
CHLORAPREP W/TINT 26ML (MISCELLANEOUS) ×1 IMPLANT
CLOSURE WOUND 1/2 X4 (GAUZE/BANDAGES/DRESSINGS)
CONT SPEC 4OZ CLIKSEAL STRL BL (MISCELLANEOUS) ×7 IMPLANT
DECANTER SPIKE VIAL GLASS SM (MISCELLANEOUS) ×1 IMPLANT
DERMABOND ADVANCED (GAUZE/BANDAGES/DRESSINGS)
DERMABOND ADVANCED .7 DNX12 (GAUZE/BANDAGES/DRESSINGS) ×1 IMPLANT
DIFFUSER DRILL AIR PNEUMATIC (MISCELLANEOUS) ×3 IMPLANT
DRAPE MICROSCOPE LEICA (MISCELLANEOUS) ×3 IMPLANT
DRAPE POUCH INSTRU U-SHP 10X18 (DRAPES) ×3 IMPLANT
DRAPE SURG 17X23 STRL (DRAPES) ×3 IMPLANT
DRILL NEURO 2X3.1 SOFT TOUCH (MISCELLANEOUS)
DRSG OPSITE POSTOP 4X10 (GAUZE/BANDAGES/DRESSINGS) ×2 IMPLANT
DRSG OPSITE POSTOP 4X8 (GAUZE/BANDAGES/DRESSINGS) ×2 IMPLANT
ELECT REM PT RETURN 9FT ADLT (ELECTROSURGICAL) ×3
ELECTRODE REM PT RTRN 9FT ADLT (ELECTROSURGICAL) ×1 IMPLANT
GAUZE SPONGE 4X4 12PLY STRL (GAUZE/BANDAGES/DRESSINGS) IMPLANT
GAUZE SPONGE 4X4 16PLY XRAY LF (GAUZE/BANDAGES/DRESSINGS) IMPLANT
GLOVE BIOGEL PI IND STRL 7.5 (GLOVE) ×1 IMPLANT
GLOVE BIOGEL PI INDICATOR 7.5 (GLOVE) ×4
GLOVE SS BIOGEL STRL SZ 7.5 (GLOVE) ×2 IMPLANT
GLOVE SUPERSENSE BIOGEL SZ 7.5 (GLOVE) ×4
GLOVE SURG SS PI 7.0 STRL IVOR (GLOVE) ×8 IMPLANT
GOWN STRL REUS W/ TWL LRG LVL3 (GOWN DISPOSABLE) ×1 IMPLANT
GOWN STRL REUS W/ TWL XL LVL3 (GOWN DISPOSABLE) IMPLANT
GOWN STRL REUS W/TWL LRG LVL3 (GOWN DISPOSABLE) ×6
GOWN STRL REUS W/TWL XL LVL3 (GOWN DISPOSABLE)
GRAFT DURAGEN MATRIX 2WX2L ×2 IMPLANT
HEMOSTAT POWDER KIT SURGIFOAM (HEMOSTASIS) ×3 IMPLANT
KIT BASIN OR (CUSTOM PROCEDURE TRAY) ×3 IMPLANT
KIT ROOM TURNOVER OR (KITS) ×3 IMPLANT
NDL HYPO 18GX1.5 BLUNT FILL (NEEDLE) ×1 IMPLANT
NDL HYPO 21X1.5 SAFETY (NEEDLE) ×2 IMPLANT
NDL HYPO 25X1 1.5 SAFETY (NEEDLE) ×1 IMPLANT
NEEDLE HYPO 18GX1.5 BLUNT FILL (NEEDLE) IMPLANT
NEEDLE HYPO 21X1.5 SAFETY (NEEDLE) ×6 IMPLANT
NEEDLE HYPO 25X1 1.5 SAFETY (NEEDLE) IMPLANT
NS IRRIG 1000ML POUR BTL (IV SOLUTION) ×3 IMPLANT
OIL CARTRIDGE MAESTRO DRILL (MISCELLANEOUS) ×3
PACK LAMINECTOMY NEURO (CUSTOM PROCEDURE TRAY) ×3 IMPLANT
PACK UNIVERSAL I (CUSTOM PROCEDURE TRAY) ×3 IMPLANT
PAD ARMBOARD 7.5X6 YLW CONV (MISCELLANEOUS) ×9 IMPLANT
PATTIES SURGICAL .5X1.5 (GAUZE/BANDAGES/DRESSINGS) ×1 IMPLANT
RUBBERBAND STERILE (MISCELLANEOUS) ×6 IMPLANT
SEALANT ADHERUS EXTEND TIP (MISCELLANEOUS) ×2 IMPLANT
SPONGE NEURO XRAY DETECT 1X3 (DISPOSABLE) ×1 IMPLANT
SPONGE SURGIFOAM ABS GEL SZ50 (HEMOSTASIS) ×3 IMPLANT
STAPLER VISISTAT 35W (STAPLE) ×4 IMPLANT
STRIP CLOSURE SKIN 1/2X4 (GAUZE/BANDAGES/DRESSINGS) IMPLANT
SUT PROLENE 1 CT (SUTURE) ×12 IMPLANT
SUT PROLENE 6 0 BV (SUTURE) ×2 IMPLANT
SUT STRATAFIX MNCRL+ 3-0 PS-2 (SUTURE)
SUT STRATAFIX MONOCRYL 3-0 (SUTURE)
SUT VIC AB 0 CT1 18XCR BRD8 (SUTURE) ×1 IMPLANT
SUT VIC AB 0 CT1 8-18 (SUTURE) ×9
SUT VIC AB 2-0 CT1 18 (SUTURE) ×3 IMPLANT
SUT VIC AB 4-0 PS2 27 (SUTURE) IMPLANT
SUTURE STRATFX MNCRL+ 3-0 PS-2 (SUTURE) IMPLANT
SYR 30ML LL (SYRINGE) ×6 IMPLANT
SYR 5ML LL (SYRINGE) ×1 IMPLANT
TOWEL OR 17X24 6PK STRL BLUE (TOWEL DISPOSABLE) ×3 IMPLANT
TOWEL OR 17X26 10 PK STRL BLUE (TOWEL DISPOSABLE) ×3 IMPLANT
TUBE CONNECTING 12'X1/4 (SUCTIONS)
TUBE CONNECTING 12X1/4 (SUCTIONS) ×1 IMPLANT
WATER STERILE IRR 1000ML POUR (IV SOLUTION) ×3 IMPLANT

## 2016-05-30 NOTE — H&P (Signed)
CC:  No chief complaint on file.   HPI: Alex Berry is a 61 y.o. 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  He had a large volume of clear fluid drain from his incision yesterday and had a headache.  He hasn't had fevers, neck stiffness, or purulent drainage.  He was admitted from clinic yesterday for planned wound exploration and CSF leak repair today.  PMH: Past Medical History:  Diagnosis Date  . Arthritis   . Complication of anesthesia    hard to awaken-blood pressure dropped  . Hypertension   . Pneumonia    hx child  . Pre-diabetes     PSH: Past Surgical History:  Procedure Laterality Date  . BACK SURGERY     30 yrs  . CARPAL TUNNEL RELEASE Left    30 yrs  . KNEE ARTHROPLASTY Right 01/2016  . LUMBAR LAMINECTOMY/DECOMPRESSION MICRODISCECTOMY N/A 04/28/2016   Procedure: Lumbar two-Sacral one Laminectomy;  Surgeon: Kevan Ny Jihan Mellette, MD;  Location: [place];  Service: Neurosurgery;  Laterality: N/A;    SH: Social History  Substance Use Topics  . Smoking status: Former Smoker    Packs/day: 1.50    Years: 15.00    Quit date: 04/19/1996  . Smokeless tobacco: Never Used  . Alcohol use No    MEDS: Prior to Admission medications   Medication Sig Start Date End Date Taking? Authorizing Provider  acetaminophen (TYLENOL) 650 MG CR tablet Take 650-1,300 mg by mouth every 8 (eight) hours as needed for pain.    Yes Historical Provider, MD  amLODipine (NORVASC) 10 MG tablet Take 10 mg by mouth daily. 02/28/16  Yes Historical Provider, MD  APPLE CIDER VINEGAR PO Take 15 mLs by mouth 2 (two) times a week.    Yes Historical Provider, MD  aspirin EC 81 MG tablet Take 81 mg by mouth daily.   Yes Historical Provider, MD  atenolol (TENORMIN) 25 MG tablet Take 25 mg by mouth daily. 02/28/16  Yes Historical Provider, MD  lisinopril (PRINIVIL,ZESTRIL) 40 MG tablet Take 40 mg by mouth at bedtime.  02/28/16  Yes Historical Provider, MD  Misc Natural  Products (GLUCOSAMINE CHOND COMPLEX/MSM PO) Take 1 tablet by mouth daily.   Yes Historical Provider, MD  Multiple Vitamin (MULTIVITAMIN WITH MINERALS) TABS tablet Take 1 tablet by mouth every other day.    Yes Historical Provider, MD  oxyCODONE (OXY IR/ROXICODONE) 5 MG immediate release tablet Take 1-2 tablets (5-10 mg total) by mouth every 3 (three) hours as needed for breakthrough pain. 04/29/16  Yes Earnie Larsson, MD  polycarbophil (FIBERCON) 625 MG tablet Take 625 mg by mouth daily.   Yes Historical Provider, MD  Turmeric Curcumin 500 MG CAPS Take 500 mg by mouth daily.   Yes Historical Provider, MD    ALLERGY: Allergies  Allergen Reactions  . No Known Allergies     ROS: ROS  NEUROLOGIC EXAM: Awake, alert, oriented Memory and concentration grossly intact Speech fluent, appropriate CN grossly intact Motor exam: Upper Extremities Deltoid Bicep Tricep Grip  Right 5/5 5/5 5/5 5/5  Left 5/5 5/5 5/5 5/5   Lower Extremity IP Quad PF DF EHL  Right 5/5 5/5 5/5 5/5 5/5  Left 5/5 5/5 5/5 5/5 5/5   Sensation grossly intact to LT  IMAGING: No imaging  IMPRESSION: - 61 y.o. male with post-operative CSF leak.  He is neurologically intact.  PLAN: - Lumbar wound exploration for CSF leak repair - Continue antibiotics

## 2016-05-30 NOTE — Care Management Note (Signed)
Case Management Note  Patient Details  Name: Alex Berry MRN: [id number] Date of Birth: 20-Mar-1956  Subjective/Objective:            Patient was a direct admit from the neurosurgery office for a CSF leak repair. CM will follow for discharge needs pending post-op progress and physician orders.         Action/Plan:   Expected Discharge Date:                  Expected Discharge Plan:     In-House Referral:     Discharge planning Services     Post Acute Care Choice:    Choice offered to:     DME Arranged:    DME Agency:     HH Arranged:    HH Agency:     Status of Service:     If discussed at [employer] of Stay Meetings, dates discussed:    Additional Comments:  Rolm Baptise, RN 05/30/2016, 10:58 AM

## 2016-05-30 NOTE — Anesthesia Procedure Notes (Signed)
Procedure Name: Intubation Date/Time: 05/30/2016 1:39 PM Performed by: Eligha Bridegroom Pre-anesthesia Checklist: Suction available, Emergency Drugs available, Patient identified, Patient being monitored and Timeout performed Patient Re-evaluated:Patient Re-evaluated prior to inductionOxygen Delivery Method: Circle system utilized Preoxygenation: Pre-oxygenation with 100% oxygen Intubation Type: IV induction Ventilation: Mask ventilation without difficulty and Oral airway inserted - appropriate to patient size Laryngoscope Size: Mac and 4 Grade View: Grade II Tube type: Oral Tube size: 7.5 mm Number of attempts: 1 Placement Confirmation: ETT inserted through vocal cords under direct vision,  positive ETCO2 and breath sounds checked- equal and bilateral Secured at: 22 cm Tube secured with: Tape Dental Injury: Teeth and Oropharynx as per pre-operative assessment

## 2016-05-30 NOTE — Anesthesia Preprocedure Evaluation (Addendum)
Anesthesia Evaluation  Patient identified by MRN, date of birth, ID band Patient awake    Reviewed: Allergy & Precautions, H&P , NPO status , Patient's Chart, lab work & pertinent test results  History of Anesthesia Complications Negative for: history of anesthetic complications  Airway Mallampati: II  TM Distance: >3 FB Neck ROM: full    Dental  (+) Teeth Intact   Pulmonary former smoker,    Pulmonary exam normal breath sounds clear to auscultation       Cardiovascular hypertension, Pt. on medications Normal cardiovascular exam Rhythm:regular Rate:Normal     Neuro/Psych    GI/Hepatic   Endo/Other    Renal/GU      Musculoskeletal  (+) Arthritis ,   Abdominal   Peds  Hematology   Anesthesia Other Findings   Reproductive/Obstetrics                            Anesthesia Physical Anesthesia Plan  ASA: II  Anesthesia Plan: General   Post-op Pain Management:    Induction: Intravenous  Airway Management Planned: Oral ETT  Additional Equipment: None  Intra-op Plan:   Post-operative Plan: Extubation in OR  Informed Consent: I have reviewed the patients History and Physical, chart, labs and discussed the procedure including the risks, benefits and alternatives for the proposed anesthesia with the patient or authorized representative who has indicated his/her understanding and acceptance.   Dental advisory given  Plan Discussed with: CRNA and Surgeon  Anesthesia Plan Comments:         Anesthesia Quick Evaluation

## 2016-05-30 NOTE — Transfer of Care (Signed)
Immediate Anesthesia Transfer of Care Note  Patient: Alex Berry  Procedure(s) Performed: Procedure(s): Exploration of Lumbar Wound and Repair of Cerebrospinal Fluid Fistula (N/A)  Patient Location: PACU  Anesthesia Type:General  Level of Consciousness: awake, alert  and oriented  Airway & Oxygen Therapy: Patient Spontanous Breathing and Patient connected to nasal cannula oxygen  Post-op Assessment: Report given to RN and Post -op Vital signs reviewed and stable  Post vital signs: Reviewed and stable  Last Vitals:  Vitals:   05/30/16 0507 05/30/16 1015  BP: 121/65 (!) 143/74  Pulse: 72 71  Resp: 16 18  Temp: 36.8 C 36.7 C    Last Pain:  Vitals:   05/30/16 1015  TempSrc: Oral  PainSc:          Complications: No apparent anesthesia complications

## 2016-05-30 NOTE — Brief Op Note (Signed)
05/29/2016 - 05/30/2016  3:18 PM  PATIENT:  Alex Berry  61 y.o. male  PRE-OPERATIVE DIAGNOSIS:  Cerebrospinal Fluid Leak  POST-OPERATIVE DIAGNOSIS:  Cerebrospinal Fluid Leak  PROCEDURE:  Procedure(s): Exploration of Lumbar Wound and Repair of Cerebrospinal Fluid Fistula (N/A)  SURGEON:  Surgeon(s) and Role:    * Tamala Fothergill, MD - Primary  PHYSICIAN ASSISTANT:   ASSISTANTS: none   ANESTHESIA:   general  EBL:  Total I/O In: 1000 [I.V.:1000] Out: 400 [Urine:400]  BLOOD ADMINISTERED:none  DRAINS: none   LOCAL MEDICATIONS USED:  MARCAINE    and LIDOCAINE   SPECIMEN:  No Specimen  DISPOSITION OF SPECIMEN:  N/A  COUNTS:  YES  TOURNIQUET:  * No tourniquets in log *  DICTATION: .Dragon Dictation  PLAN OF CARE: Admit to inpatient   PATIENT DISPOSITION:  PACU - hemodynamically stable.   Delay start of Pharmacological VTE agent (>24hrs) due to surgical blood loss or risk of bleeding: yes

## 2016-05-31 LAB — BASIC METABOLIC PANEL
Anion gap: 7 (ref 5–15)
BUN: 9 mg/dL (ref 6–20)
CO2: 30 mmol/L (ref 22–32)
CREATININE: 1.04 mg/dL (ref 0.61–1.24)
Calcium: 9.8 mg/dL (ref 8.9–10.3)
Chloride: 98 mmol/L — ABNORMAL LOW (ref 101–111)
GFR calc Af Amer: >60 mL/min (ref >60)
GLUCOSE: 111 mg/dL — AB (ref 65–99)
Potassium: 4 mmol/L (ref 3.5–5.1)
SODIUM: 135 mmol/L (ref 135–145)

## 2016-05-31 NOTE — Progress Notes (Signed)
Pt seen and examined. No issues overnight.  EXAM: Temp:  [97.5 F (36.4 C)-98.6 F (37 C)] 98.6 F (37 C) (01/31 0453) Pulse Rate:  [60-80] 68 (01/31 0850) Resp:  [11-18] 16 (01/31 0850) BP: (114-143)/(57-83) 127/67 (01/31 0850) SpO2:  [96 %-100 %] 97 % (01/31 0850) Weight:  [115.7 kg (255 lb)] 115.7 kg (255 lb) (01/31 0100) Intake/Output      01/30 0701 - 01/31 0700 01/31 0701 - 02/01 0700   P.O. 1200    I.V. (mL/kg) 1000 (8.6)    IV Piggyback     Total Intake(mL/kg) 2200 (19)    Urine (mL/kg/hr) 2750 (1)    Blood 100 (0)    Total Output 2850     Net -650          Urine Occurrence 3 x     Awake and alert Follows commands throughout Full strength Wound flat, some blood, no CSF  Stable Continue head of bed flat for one more day.

## 2016-05-31 NOTE — Progress Notes (Signed)
Nutrition Brief Note  Patient identified on the Malnutrition Screening Tool (MST) Report. Per MST report, pt stated that wt loss was intentional (he wanted to lose weight) and he denied eating poorly because of a decreased appetite.   Wt Readings from Last 15 Encounters:  05/31/16 255 lb (115.7 kg)  04/28/16 255 lb (115.7 kg)  04/19/16 255 lb (115.7 kg)   Pt asleep at time of visit. There is no evidence of any weight loss in the past month per weight history. Pt appears well-nourished based on arms, face, shoulders and abdomen. Per nursing notes pt ate 90% of breakfast this morning.   Body mass index is 31.04 kg/m. Patient meets criteria for Obesity based on current BMI.   Current diet order is Regular, patient is consuming approximately 90% of meals at this time. Labs and medications reviewed.   No nutrition interventions warranted at this time. If nutrition issues arise, please consult RD.   Scarlette Ar RD, CSP, LDN Inpatient Clinical Dietitian Pager: [phone] After Hours Pager: [phone]

## 2016-05-31 NOTE — Op Note (Signed)
05/29/2016 - 05/30/2016  9:49 AM  PATIENT:  Alex Berry  61 y.o. male  PRE-OPERATIVE DIAGNOSIS:  Post-operative cerebrospinal fluid leak  POST-OPERATIVE DIAGNOSIS:  Same  PROCEDURE:  Exploration of lumbar wound and repair of cerebrospinal fluid fistula.  SURGEON:  Aldean Ast, MD  ASSISTANTS: None  ANESTHESIA:   General  DRAINS: None   SPECIMEN:  None  INDICATION FOR PROCEDURE: 61 year old male presents with post-operative cerebrospinal fluid leak.  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  He leaked through the skin on the day of admission.  At his initial surgery there was not a CSF leak.  I recommended the above operation. Patient understood the risks, benefits, and alternatives and potential outcomes and wished to proceed.  PROCEDURE DETAILS: After smooth induction of general anesthesia the patient was turned prone on the OR table.  The patient was prepped and draped in the usual sterile fashion.  The scar was excised and the wound was re-opened.  I encountered a large volume of clear fluid consistent with spinal fluid.  I exposed the thecal sac and found a small hole on the right side of the thecal sac.  The high speed burr was used to thin the bone adjacent to this and then it was resected with a Kerrison rongeur.  The microscope was draped and brought into the field to provide light and magnification.  Using microsurgical technique I placed a figure of 8 stitch with a 6-0 prolene suture and placed a small piece of locally harvested fat within the knot.  Several valsalva maneuvers were performed and there was no spinal fluid egress.  This was then sealed with Adherus.  I irrigated with bacitracin saline.  I then placed duragen over the thecal sac and again applied a layer of adherus.  The wound was closed in routine anatomic layers with interrupted vicryl sutures.  The skin and deep dermal layer was closed with interrupted vertical mattress  prolene sutures and staples.  The patient was turned to the supine position and awoke without difficulty.  PATIENT DISPOSITION:  PACU - hemodynamically stable.   Delay start of Pharmacological VTE agent (>24hrs) due to surgical blood loss or risk of bleeding:  yes

## 2016-05-31 NOTE — Anesthesia Postprocedure Evaluation (Signed)
Anesthesia Post Note  Patient: Alex Berry  Procedure(s) Performed: Procedure(s) (LRB): Exploration of Lumbar Wound and Repair of Cerebrospinal Fluid Fistula (N/A)  Patient location during evaluation: PACU Anesthesia Type: General Level of consciousness: awake and alert Pain management: pain level controlled Vital Signs Assessment: post-procedure vital signs reviewed and stable Respiratory status: spontaneous breathing, nonlabored ventilation, respiratory function stable and patient connected to nasal cannula oxygen Cardiovascular status: blood pressure returned to baseline and stable Postop Assessment: no signs of nausea or vomiting Anesthetic complications: no       Last Vitals:  Vitals:   05/31/16 0147 05/31/16 0453  BP: 127/70 (!) 114/57  Pulse: 64 60  Resp: 18 18  Temp: 36.7 C 37 C    Last Pain:  Vitals:   05/31/16 0652  TempSrc:   PainSc: [place] DAVID

## 2016-06-01 ENCOUNTER — Encounter (HOSPITAL_COMMUNITY): Payer: Self-pay | Admitting: Neurological Surgery

## 2016-06-01 MED ORDER — GABAPENTIN 300 MG PO CAPS: 300.0000 mg | ORAL_CAPSULE | Freq: Three times a day (TID) | ORAL | 2 refills | Status: DC

## 2016-06-01 MED ORDER — METHOCARBAMOL 750 MG PO TABS: 750.0000 mg | ORAL_TABLET | Freq: Four times a day (QID) | ORAL | 2 refills | Status: DC | PRN

## 2016-06-01 MED ORDER — OXYCODONE HCL 5 MG PO TABS: 5.0000 mg | ORAL_TABLET | ORAL | 0 refills | Status: DC | PRN

## 2016-06-01 MED ORDER — ACETAZOLAMIDE 250 MG PO TABS: 250.0000 mg | ORAL_TABLET | Freq: Two times a day (BID) | ORAL | 0 refills | Status: DC

## 2016-06-01 MED ORDER — SULFAMETHOXAZOLE-TRIMETHOPRIM 800-160 MG PO TABS: 1.0000 | ORAL_TABLET | Freq: Two times a day (BID) | ORAL | 0 refills | Status: DC

## 2016-06-01 NOTE — Care Management Note (Signed)
Case Management Note  Patient Details  Name: Alex Berry MRN: [id number] Date of Birth: [birth year]  Subjective/Objective:                    Action/Plan: Pt discharging home with self care. Pt with insurance and PCP. No further needs per CM.   Expected Discharge Date:  06/01/16               Expected Discharge Plan:  Home/Self Care  In-House Referral:     Discharge planning Services     Post Acute Care Choice:    Choice offered to:     DME Arranged:    DME Agency:     HH Arranged:    HH Agency:     Status of Service:  Completed, signed off  If discussed at [employer] of Stay Meetings, dates discussed:    Additional Comments:  Pollie Friar, RN 06/01/2016, 1:58 PM

## 2016-06-01 NOTE — Progress Notes (Signed)
Pt seen and examined. No issues overnight.  EXAM: Temp:  [97.2 F (36.2 C)-98.6 F (37 C)] 98.1 F (36.7 C) (02/01 0515) Pulse Rate:  [64-71] 71 (02/01 0515) Resp:  [12-18] 18 (02/01 0515) BP: (109-133)/(59-84) 121/59 (02/01 0515) SpO2:  [95 %-100 %] 100 % (02/01 0515) Intake/Output      01/31 0701 - 02/01 0700 02/01 0701 - 02/02 0700   P.O. 735    I.V. (mL/kg) 1263.3 (10.9)    IV Piggyback 250    Total Intake(mL/kg) 2248.3 (19.4)    Urine (mL/kg/hr) 1425 (0.5)    Emesis/NG output 0 (0)    Blood     Total Output 1425     Net +823.3          Urine Occurrence 2 x    Emesis Occurrence 2 x     Awake and alert Follows commands throughout Full strength Bandage with dried blood, wound flat  Stable No pseudomeningocele Slowly elevate head D/c this afternoon

## 2016-06-01 NOTE — Evaluation (Signed)
Physical Therapy Evaluation & Discharge Patient Details Name: Alex Berry MRN: [id number] DOB: 18-Mar-1956 Today's Date: 06/01/2016   History of Present Illness  Pt is a 61 y.o. male s/p L2-S1 Laminectomy 12/17 no returns due to CSF leak s/p surgical repair. PMHx: HTN, Pre-diabetes, Hx back sx, R TKA.   Clinical Impression  Patient presents with mobility close to baseline following above procedure.  Did need reinforcement of back precautions especially for stair negotiation as he uses the walker for home entry.  No further skilled PT needs at this time.  Safe for d/c home with wife assist.     Follow Up Recommendations No PT follow up    Equipment Recommendations  None recommended by PT    Recommendations for Other Services       Precautions / Restrictions Precautions Precautions: Back Precaution Comments: recalled precuations but reinforced during mobility Required Braces or Orthoses:  (no brace)      Mobility  Bed Mobility Overal bed mobility: Modified Independent             General bed mobility comments: performed with appropriate technique  Transfers Overall transfer level: Needs assistance Equipment used: Rolling walker (2 wheeled) Transfers: Sit to/from Stand Sit to Stand: Supervision         General transfer comment: for safety  Ambulation/Gait Ambulation/Gait assistance: Modified independent (Device/Increase time) Ambulation Distance (Feet): 200 Feet Assistive device: Rolling walker (2 wheeled) Gait Pattern/deviations: Step-through pattern;Decreased stride length     General Gait Details: no difficulties or LOB,   Stairs Stairs: Yes Stairs assistance: Min guard Stair Management: One rail Right;Alternating pattern;Forwards;With walker Number of Stairs: 2 (x 2) General stair comments: practiced with RW as pt uses at home and needed cues for technique to avoid bending and lifting walker on steps  Wheelchair Mobility    Modified Rankin  (Stroke Patients Only)       Balance Overall balance assessment: No apparent balance deficits (not formally assessed)                                           Pertinent Vitals/Pain Pain Assessment: No/denies pain    Home Living Family/patient expects to be discharged to:: Private residence Living Arrangements: Spouse/significant other Available Help at Discharge: Family;Available 24 hours/day Type of Home: House Home Access: Stairs to enter Entrance Stairs-Rails: None Entrance Stairs-Number of Steps: 2 Home Layout: One level Home Equipment: Walker - 2 wheels;Bedside commode;Shower seat - built in      Prior Function Level of Independence: Independent               Journalist, newspaper        Extremity/Trunk Assessment                Communication   Communication: No difficulties  Cognition Arousal/Alertness: Awake/alert Behavior During Therapy: WFL for tasks assessed/performed Overall Cognitive Status: Within Functional Limits for tasks assessed                      General Comments General comments (skin integrity, edema, etc.): wife arrived end of session, updated on progress, reviewed precautions and how to help him on stairs; simulated/demo car transfers and discussed sleeping in recliner    Exercises     Assessment/Plan    PT Assessment Patent does not need any further PT services  PT Problem List  PT Treatment Interventions      PT Goals (Current goals can be found in the Care Plan section)  Acute Rehab PT Goals Patient Stated Goal: To go home PT Goal Formulation: All assessment and education complete, DC therapy    Frequency     Barriers to discharge        Co-evaluation               End of Session   Activity Tolerance: Patient tolerated treatment well Patient left: in bed;with call bell/phone within reach;with family/visitor present Nurse Communication: Other (comment) (ready for d/c)          Time: 1351-1415 PT Time Calculation (min) (ACUTE ONLY): 24 min   Charges:   PT Evaluation $PT Eval Low Complexity: 1 Procedure PT Treatments $Gait Training: 8-22 mins   PT G CodesReginia Berry 2016-06-22, 2:59 PM  Alex Berry, [place] June 22, 2016

## 2016-06-01 NOTE — Discharge Summary (Signed)
Date of Admission: 05/29/2016  Date of Discharge: 06/01/16  PRE-OPERATIVE DIAGNOSIS:  Post-operative cerebrospinal fluid leak  POST-OPERATIVE DIAGNOSIS:  Same  PROCEDURE:  Exploration of lumbar wound and repair of cerebrospinal fluid fistula.  Attending: Kevan Ny Ditty, MD  Hospital Course:  The patient was admitted for the above listed operation and had an uncomplicated post-operative course.  They were discharged in stable condition.  Follow up: 3 weeks  Allergies as of 06/01/2016      Reactions   No Known Allergies       Medication List    TAKE these medications   acetaminophen 650 MG CR tablet Commonly known as:  TYLENOL Take 650-1,300 mg by mouth every 8 (eight) hours as needed for pain.   acetaZOLAMIDE 250 MG tablet Commonly known as:  DIAMOX Take 1 tablet (250 mg total) by mouth 2 (two) times daily.   amLODipine 10 MG tablet Commonly known as:  NORVASC Take 10 mg by mouth daily.   APPLE CIDER VINEGAR PO Take 15 mLs by mouth 2 (two) times a week.   aspirin EC 81 MG tablet Take 81 mg by mouth daily.   atenolol 25 MG tablet Commonly known as:  TENORMIN Take 25 mg by mouth daily.   gabapentin 300 MG capsule Commonly known as:  NEURONTIN Take 1 capsule (300 mg total) by mouth 3 (three) times daily.   GLUCOSAMINE CHOND COMPLEX/MSM PO Take 1 tablet by mouth daily.   lisinopril 40 MG tablet Commonly known as:  PRINIVIL,ZESTRIL Take 40 mg by mouth at bedtime.   methocarbamol 750 MG tablet Commonly known as:  ROBAXIN Take 1 tablet (750 mg total) by mouth every 6 (six) hours as needed for muscle spasms.   multivitamin with minerals Tabs tablet Take 1 tablet by mouth every other day.   oxyCODONE 5 MG immediate release tablet Commonly known as:  Oxy IR/ROXICODONE Take 1-2 tablets (5-10 mg total) by mouth every 4 (four) hours as needed for severe pain. What changed:  when to take this  reasons to take this   polycarbophil 625 MG tablet Commonly  known as:  FIBERCON Take 625 mg by mouth daily.   sulfamethoxazole-trimethoprim 800-160 MG tablet Commonly known as:  BACTRIM DS,SEPTRA DS Take 1 tablet by mouth 2 (two) times daily.   Turmeric Curcumin 500 MG Caps Take 500 mg by mouth daily.

## 2016-06-01 NOTE — Progress Notes (Signed)
D/C instructions and summary provided to patient and wife. Denies questions or concerns at this time. Patient transported to front entrance via [facility] by charge RN.

## 2017-12-28 IMAGING — DX DG CHEST 2V
2 series · 2 of 2 positions shown · non-contrast
Comparison: None.

CLINICAL DATA: CSF leak

EXAM:
CHEST  2 VIEW

[chest pa]
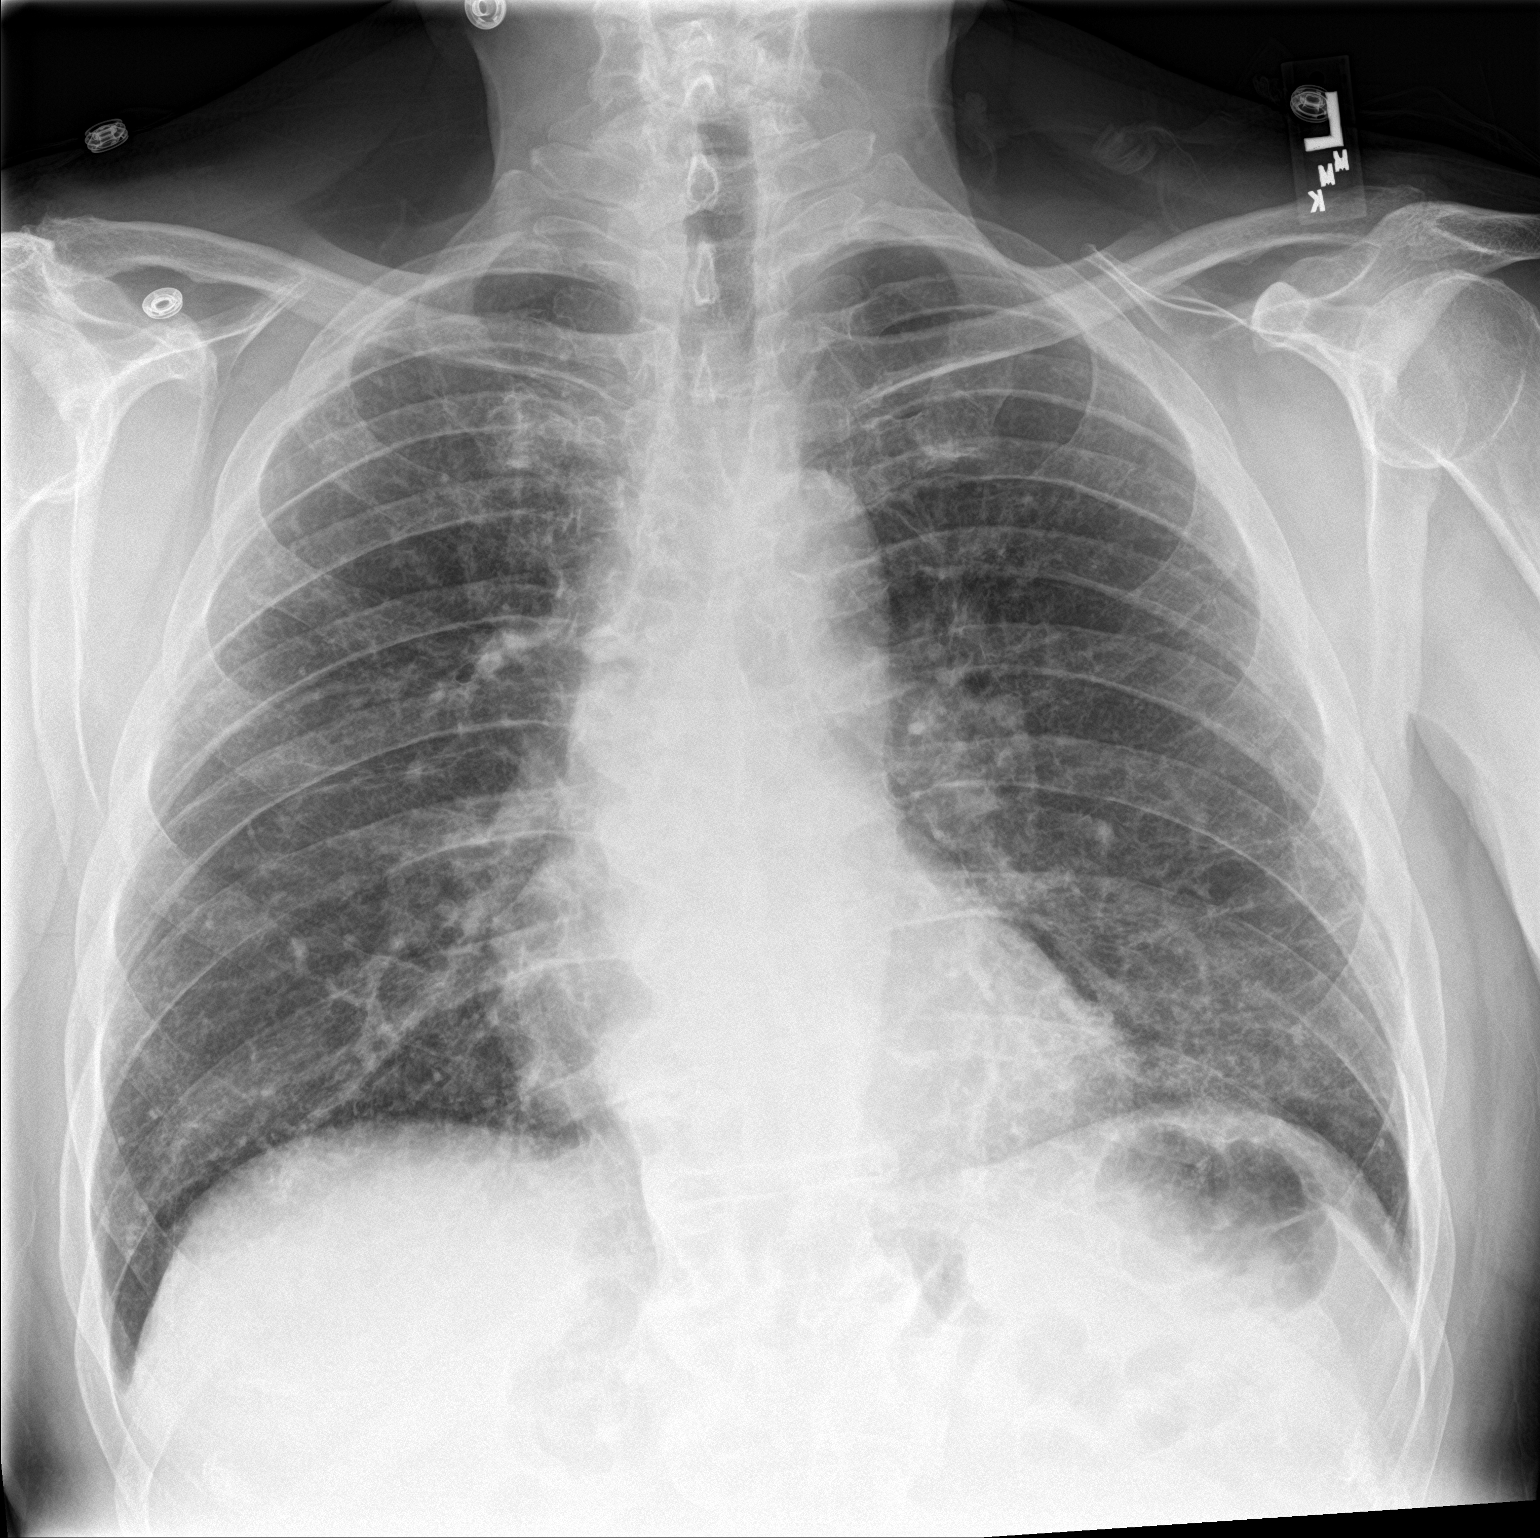

[chest lat]
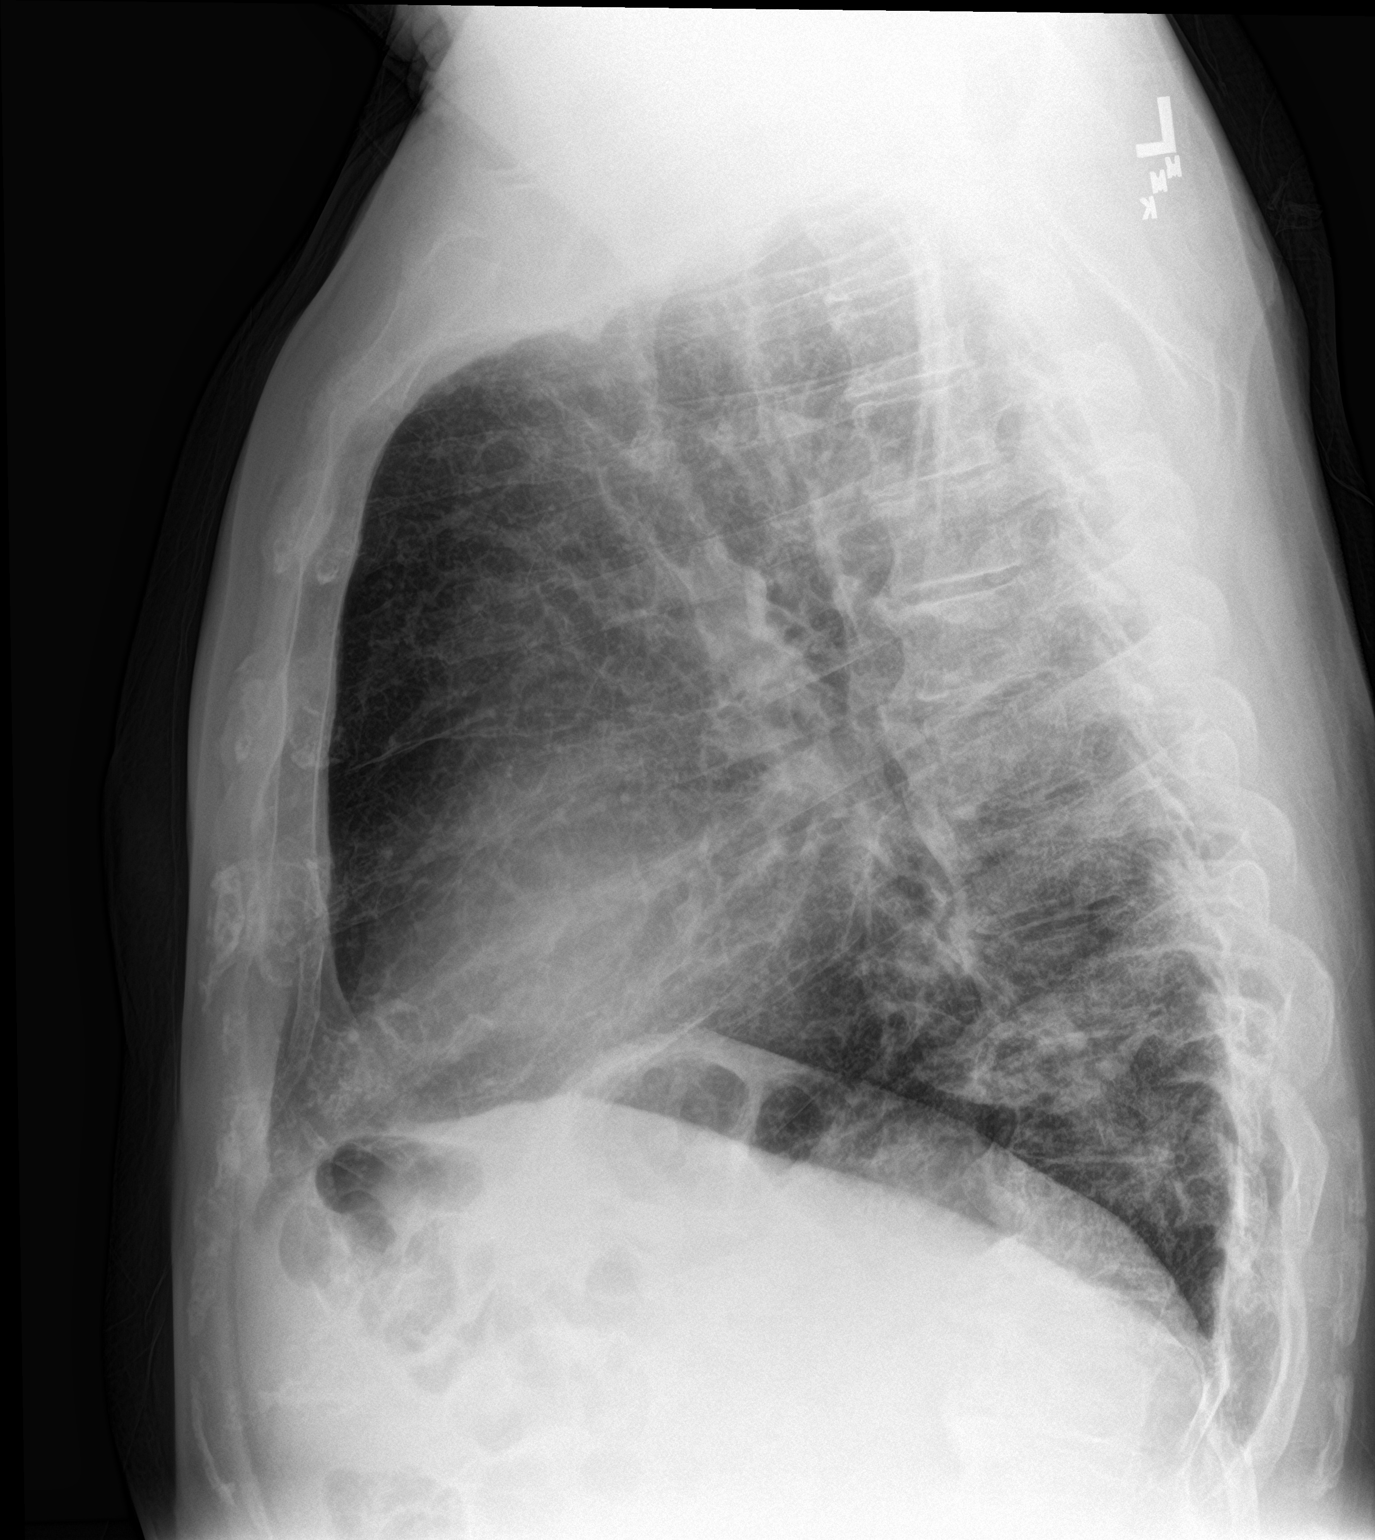

[2 of 2 positions shown; findings below may reference images not displayed]

FINDINGS: Normal heart size. Hazy opacities are present in the posterior basal
left lower lobe. Right lung is clear. No pneumothorax. No pleural
effusion. Normal vascularity.
IMPRESSION: Left lower lobe pneumonia. Followup PA and lateral chest X-ray is
recommended in 3-4 weeks following trial of antibiotic therapy to
ensure resolution and exclude underlying malignancy.

## 2021-03-16 ENCOUNTER — Telehealth: Payer: Self-pay | Admitting: Oncology

## 2021-03-16 NOTE — Telephone Encounter (Signed)
Scheduled appt per 11/15 referral. Pt is aware of appt date and time.  

## 2021-04-04 ENCOUNTER — Other Ambulatory Visit: Payer: Self-pay | Admitting: Oncology

## 2021-04-04 DIAGNOSIS — E278 Other specified disorders of adrenal gland: Secondary | ICD-10-CM | POA: Insufficient documentation

## 2021-04-04 DIAGNOSIS — D892 Hypergammaglobulinemia, unspecified: Secondary | ICD-10-CM

## 2021-04-04 NOTE — Progress Notes (Signed)
[facility]  [address] [place],  [place]  [address] [phone]  Clinic Day:  04/05/2021  Referring physician: Orlinda Blalock NP   HISTORY OF PRESENT ILLNESS:  The patient is a 65 y.o. 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  A serum protein electrophoresis in November 2022 showed that his total globulin level was elevated at 4.8 g/dL.  However, there was no evidence of a monoclonal spike being present.  Furthermore, his total protein level was normal at 7.7.  His albumin level was minimally low at 3.7.  According to the patient, he has been dealing with a dry cough for numerous months.  A pulmonologist in [place] ordered a chest CT which apparently showed evidence of scarring and inflammation.  Around the same time, a battery of labs was done, including the aforementioned serum protein electrophoresis.  The patient denies ever being told before of having an elevated protein or any type of abnormal protein level in his body.  With respect to his lung changes on his chest CT, he has received 3 different courses of prednisone over the past few months.  He is currently taking prednisone 10 mg daily upon the request of his pulmonologist.  Although he still has a dry cough, it is not as prominent as it was before.  He denies having other systemic symptoms that impact him on a daily basis.  PAST MEDICAL HISTORY:   Past Medical History:  Diagnosis Date   Arthritis    Complication of anesthesia    hard to awaken-blood pressure dropped   GERD (gastroesophageal reflux disease)    Glaucoma    Hypertension    Lung fibrosis ([place])    Myelolipoma of adrenal gland    Pneumonia    hx child   Pre-diabetes     PAST SURGICAL HISTORY:   Past Surgical History:  Procedure Laterality Date   BACK SURGERY     X 3   CARPAL TUNNEL RELEASE Bilateral    KNEE ARTHROPLASTY Right 01/2016   LUMBAR LAMINECTOMY/DECOMPRESSION MICRODISCECTOMY N/A  04/28/2016   Procedure: Lumbar two-Sacral one Laminectomy;  Surgeon: Kevan Ny Ditty, MD;  Location: [facility];  Service: Neurosurgery;  Laterality: N/A;   LUMBAR WOUND DEBRIDEMENT N/A 05/30/2016   Procedure: Exploration of Lumbar Wound and Repair of Cerebrospinal Fluid Fistula;  Surgeon: Kevan Ny Ditty, MD;  Location: [facility];  Service: Neurosurgery;  Laterality: N/A;    CURRENT MEDICATIONS:   Current Outpatient Medications  Medication Sig Dispense Refill   acetaminophen (TYLENOL) 650 MG CR tablet Take 650-1,300 mg by mouth every 8 (eight) hours as needed for pain.      acetaZOLAMIDE (DIAMOX) 250 MG tablet Take 1 tablet (250 mg total) by mouth 2 (two) times daily. 60 tablet 0   amLODipine (NORVASC) 10 MG tablet Take 10 mg by mouth daily.     APPLE CIDER VINEGAR PO Take 15 mLs by mouth 2 (two) times a week.      aspirin EC 81 MG tablet Take 81 mg by mouth daily.     atenolol (TENORMIN) 25 MG tablet Take 25 mg by mouth daily.     gabapentin (NEURONTIN) 300 MG capsule Take 1 capsule (300 mg total) by mouth 3 (three) times daily. 90 capsule 2   lisinopril (PRINIVIL,ZESTRIL) 40 MG tablet Take 40 mg by mouth at bedtime.      methocarbamol (ROBAXIN) 750 MG tablet Take 1 tablet (750 mg total) by mouth every 6 (six)  hours as needed for muscle spasms. 120 tablet 2   Misc Natural Products (GLUCOSAMINE CHOND COMPLEX/MSM PO) Take 1 tablet by mouth daily.     Multiple Vitamin (MULTIVITAMIN WITH MINERALS) TABS tablet Take 1 tablet by mouth every other day.      oxyCODONE (OXY IR/ROXICODONE) 5 MG immediate release tablet Take 1-2 tablets (5-10 mg total) by mouth every 4 (four) hours as needed for severe pain. 60 tablet 0   polycarbophil (FIBERCON) 625 MG tablet Take 625 mg by mouth daily.     sulfamethoxazole-trimethoprim (BACTRIM DS,SEPTRA DS) 800-160 MG tablet Take 1 tablet by mouth 2 (two) times daily. 28 tablet 0   Turmeric Curcumin 500 MG CAPS Take 500 mg by mouth daily.     No current  facility-administered medications for this visit.    ALLERGIES:   Allergies  Allergen Reactions   No Known Allergies     FAMILY HISTORY:   Family History  Problem Relation Age of Onset   Hypertension Mother    Diabetes Mother    Stroke Father    Lung cancer Sister     SOCIAL HISTORY:  The patient was born and raised in [place].  He lives in [place] with his wife of 41 years.  They have 2 children and 2 grandchildren.  He worked for numerous years at a [employer].  He also worked for the [employer].  He did smoke as much as a pack and a half of cigarettes daily for 20 years before quitting 35 years ago.  There is no history of alcohol abuse.  REVIEW OF SYSTEMS:  Review of Systems  Constitutional:  Negative for fatigue, fever and unexpected weight change.  Respiratory:  Positive for cough and shortness of breath. Negative for chest tightness and hemoptysis.   Cardiovascular:  Negative for chest pain and palpitations.  Gastrointestinal:  Negative for abdominal distention, abdominal pain, blood in stool, constipation, diarrhea, nausea and vomiting.  Genitourinary:  Negative for dysuria, frequency and hematuria.   Musculoskeletal:  Negative for arthralgias, back pain and myalgias.  Skin:  Negative for itching and rash.  Neurological:  Negative for dizziness, headaches and light-headedness.  Psychiatric/Behavioral:  Negative for depression and suicidal ideas. The patient is not nervous/anxious.     PHYSICAL EXAM:  Blood pressure 138/82, pulse 99, temperature 98.5 F (36.9 C), resp. rate 16, height 6\' 3"  (1.905 m), weight 257 lb 9.6 oz (116.8 kg), SpO2 96 %. Wt Readings from Last 3 Encounters:  04/05/21 257 lb 9.6 oz (116.8 kg)  05/31/16 255 lb (115.7 kg)  04/28/16 255 lb (115.7 kg)   Body mass index is 32.2 kg/m. Performance status (ECOG): 0 - Asymptomatic Physical Exam Constitutional:      Appearance: Normal appearance. He is not  ill-appearing.  HENT:     Mouth/Throat:     Mouth: Mucous membranes are moist.     Pharynx: Oropharynx is clear. No oropharyngeal exudate or posterior oropharyngeal erythema.  Cardiovascular:     Rate and Rhythm: Normal rate and regular rhythm.     Heart sounds: No murmur heard.   No friction rub. No gallop.  Pulmonary:     Effort: Pulmonary effort is normal. No respiratory distress.     Breath sounds: Normal breath sounds. No wheezing, rhonchi or rales.  Abdominal:     General: Bowel sounds are normal. There is no distension.     Palpations: Abdomen is soft. There is no mass.     Tenderness: There  is no abdominal tenderness.  Musculoskeletal:        General: No swelling.     Right lower leg: No edema.     Left lower leg: No edema.  Lymphadenopathy:     Cervical: No cervical adenopathy.     Upper Body:     Right upper body: No supraclavicular or axillary adenopathy.     Left upper body: No supraclavicular or axillary adenopathy.     Lower Body: No right inguinal adenopathy. No left inguinal adenopathy.  Skin:    General: Skin is warm.     Coloration: Skin is not jaundiced.     Findings: No lesion or rash.  Neurological:     General: No focal deficit present.     Mental Status: He is alert and oriented to person, place, and time. Mental status is at baseline.  Psychiatric:        Mood and Affect: Mood normal.        Behavior: Behavior normal.        Thought Content: Thought content normal.    LABS:      ASSESSMENT & PLAN:  A 65 y.o. 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  In clinic today, I went over all of his recent labs with him, including his recent serum protein electrophoresis.  I make sure the patient understood that he did not have an M spike, which is abnormal protein that raise suspicion for a plasma cell dyscrasia being present.  Having other elevated globulin subtypes can be seen when dealing with underlying infectious or inflammatory  etiologies.  With the issues going on in his lungs, they could lead to both an elevated protein and hypergammaglobulinemia being present.  These labs do not concern me for an ominous hematologic process being present.  With respect to his CBC today, his elevated white count is undoubtedly related to his current prednisone use.  Not only is his white count elevated, but there is an even greater shift to a predominance of neutrophils, which is commonly seen when steroids are given.  He understands that after his prednisone is discontinued, both his white count and increased neutrophil percentage should return to normal over time.  As I do not appreciate any type of ominous hematologic process being present, I do feel comfortable turning his care back over to his other physicians.  I would not have a problem seeing him in the future if new hematologic or oncologic issues arise that require repeat clinical assessment.  The patient and his wife understand all the plans discussed today and are in agreement with them.    I do appreciate Orlinda Blalock, NP,  for his new consult.   Alexzandra Bilton Macarthur Critchley, MD

## 2021-04-05 ENCOUNTER — Inpatient Hospital Stay: Payer: Medicare PPO | Attending: Oncology | Admitting: Oncology

## 2021-04-05 ENCOUNTER — Inpatient Hospital Stay: Payer: Medicare PPO

## 2021-04-05 ENCOUNTER — Encounter: Payer: Self-pay | Admitting: Oncology

## 2021-04-05 ENCOUNTER — Encounter (INDEPENDENT_AMBULATORY_CARE_PROVIDER_SITE_OTHER): Payer: Self-pay

## 2021-04-05 DIAGNOSIS — R0602 Shortness of breath: Secondary | ICD-10-CM | POA: Diagnosis not present

## 2021-04-05 DIAGNOSIS — R059 Cough, unspecified: Secondary | ICD-10-CM | POA: Diagnosis not present

## 2021-04-05 DIAGNOSIS — D892 Hypergammaglobulinemia, unspecified: Secondary | ICD-10-CM | POA: Insufficient documentation

## 2021-04-08 LAB — PROTEIN ELECTROPHORESIS, SERUM
A/G Ratio: 0.8 (ref 0.7–1.7)
Albumin ELP: 3.5 g/dL (ref 2.9–4.4)
Alpha-1-Globulin: 0.3 g/dL (ref 0.0–0.4)
Alpha-2-Globulin: 0.8 g/dL (ref 0.4–1.0)
Beta Globulin: 1.3 g/dL (ref 0.7–1.3)
Gamma Globulin: 1.8 g/dL (ref 0.4–1.8)
Globulin, Total: 4.2 g/dL — ABNORMAL HIGH (ref 2.2–3.9)
Total Protein ELP: 7.7 g/dL (ref 6.0–8.5)

## 2021-04-11 DIAGNOSIS — T8859XA Other complications of anesthesia, initial encounter: Secondary | ICD-10-CM | POA: Insufficient documentation

## 2021-04-11 DIAGNOSIS — K219 Gastro-esophageal reflux disease without esophagitis: Secondary | ICD-10-CM | POA: Insufficient documentation

## 2021-04-11 DIAGNOSIS — J189 Pneumonia, unspecified organism: Secondary | ICD-10-CM | POA: Insufficient documentation

## 2021-04-11 DIAGNOSIS — I10 Essential (primary) hypertension: Secondary | ICD-10-CM | POA: Insufficient documentation

## 2021-04-11 DIAGNOSIS — D1779 Benign lipomatous neoplasm of other sites: Secondary | ICD-10-CM | POA: Insufficient documentation

## 2021-04-11 DIAGNOSIS — J8410 Pulmonary fibrosis, unspecified: Secondary | ICD-10-CM | POA: Insufficient documentation

## 2021-04-11 DIAGNOSIS — M1990 Unspecified osteoarthritis, unspecified site: Secondary | ICD-10-CM | POA: Insufficient documentation

## 2021-04-11 DIAGNOSIS — H409 Unspecified glaucoma: Secondary | ICD-10-CM | POA: Insufficient documentation

## 2021-04-11 DIAGNOSIS — R7303 Prediabetes: Secondary | ICD-10-CM | POA: Insufficient documentation

## 2021-04-15 ENCOUNTER — Other Ambulatory Visit: Payer: Self-pay

## 2021-04-15 ENCOUNTER — Encounter: Payer: Self-pay | Admitting: Cardiology

## 2021-04-15 ENCOUNTER — Ambulatory Visit: Payer: Medicare PPO | Admitting: Cardiology

## 2021-04-15 VITALS — BP 130/72 | HR 73 | Ht 75.0 in | Wt 258.1 lb

## 2021-04-15 DIAGNOSIS — I10 Essential (primary) hypertension: Secondary | ICD-10-CM

## 2021-04-15 DIAGNOSIS — J8410 Pulmonary fibrosis, unspecified: Secondary | ICD-10-CM

## 2021-04-15 DIAGNOSIS — I2510 Atherosclerotic heart disease of native coronary artery without angina pectoris: Secondary | ICD-10-CM | POA: Diagnosis not present

## 2021-04-15 DIAGNOSIS — R9431 Abnormal electrocardiogram [ECG] [EKG]: Secondary | ICD-10-CM

## 2021-04-15 DIAGNOSIS — R0602 Shortness of breath: Secondary | ICD-10-CM

## 2021-04-15 MED ORDER — PRAVASTATIN SODIUM 20 MG PO TABS: 20.0000 mg | ORAL_TABLET | ORAL | 3 refills | Status: AC

## 2021-04-15 NOTE — Patient Instructions (Signed)
Medication Instructions:  Your physician has recommended you make the following change in your medication:  START: Pravastatin 20 mg take one tablet by mouth three times weekly on Monday, Wednesday, and Friday.  *If you need a refill on your cardiac medications before your next appointment, please call your pharmacy*   Lab Work: None If you have labs (blood work) drawn today and your tests are completely normal, you will receive your results only by: [place] (if you have MyChart) OR A paper copy in the mail If you have any lab test that is abnormal or we need to change your treatment, we will call you to review the results.   Testing/Procedures: Your physician has requested that you have an echocardiogram. Echocardiography is a painless test that uses sound waves to create images of your heart. It provides your doctor with information about the size and shape of your heart and how well your hearts chambers and valves are working. This procedure takes approximately one hour. There are no restrictions for this procedure.    Follow-Up: At [facility], you and your health needs are our priority.  As part of our continuing mission to provide you with exceptional heart care, we have created designated Provider Care Teams.  These Care Teams include your primary Cardiologist (physician) and Advanced Practice Providers (APPs -  Physician Assistants and Nurse Practitioners) who all work together to provide you with the care you need, when you need it.  We recommend signing up for the patient portal called "MyChart".  Sign up information is provided on this After Visit Summary.  MyChart is used to connect with patients for Virtual Visits (Telemedicine).  Patients are able to view lab/test results, encounter notes, upcoming appointments, etc.  Non-urgent messages can be sent to your provider as well.   To learn more about what you can do with MyChart, go to [email or web address].    Your  next appointment:   6 week(s)  The format for your next appointment:   In Person  Provider:   Shirlee More, MD    Other Instructions

## 2021-04-15 NOTE — Progress Notes (Signed)
Cardiology Office Note:    Date:  04/15/2021   ID:  Alex Berry, Prime 10-Oct-1955, MRN [id number]  PCP:  Earlyne Iba, NP  Cardiologist:  Shirlee More, MD   Referring MD: Earlyne Iba, NP  ASSESSMENT:    1. Coronary artery disease involving native coronary artery of native heart without angina pectoris   2. Nonspecific abnormal electrocardiogram (ECG) (EKG)   3. Primary hypertension   4. Pulmonary fibrosis ([place])   5. SOB (shortness of breath)    PLAN:    In order of problems listed above:  He has known CAD from his description mild nonobstructive heart catheterization [place]: 2003 and we will do our best to obtain that report.  He will continue aspirin and he will try lipid-lowering therapy with a low intensity statin pravastatin 3 days a week if tolerated LDL remains greater than 70 add Zetia.  If not tolerated PCSK9 inhibitor.  I will think he needs an ischemia evaluation not having angina. EKG pattern is indeterminate I have asked him to have an echocardiogram to define if he has had an infarction but also to guide if any of his shortness of breath is cardiac in etiology look at left ventricular systolic function and diastolic function RV function and pulmonary artery pressure. Improved being managed by pulmonary [place] and has follow-up in the next few weeks  Next appointment 6 weeks after his echocardiogram   Medication Adjustments/Labs and Tests Ordered: Current medicines are reviewed at length with the patient today.  Concerns regarding medicines are outlined above.  Orders Placed This Encounter  Procedures   EKG 12-Lead   ECHOCARDIOGRAM COMPLETE    Meds ordered this encounter  Medications   pravastatin (PRAVACHOL) 20 MG tablet    Sig: Take 1 tablet (20 mg total) by mouth 3 (three) times a week.    Dispense:  45 tablet    Refill:  3      Chief Complaint  Patient presents with   Abnormal ECG   He also relates a history of CAD heart catheterization  2003 30% in one artery 40% in the other.  History of Present Illness:    Alex Berry is a 65 y.o. male with a left adrenal mass , myelolipoma who is being seen today for the evaluation of an abnormal EKG at the request of Earlyne Iba, NP.  Previous MPI at [facility] 2003 did not show infarct or ischemia.  EKG personaly reviewed [place] and possible septal MI.  CT abdomen/ pelvis 03/09/2021: 1. 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 Normal right adrenal gland.  2. No acute inflammatory process in the abdomen or pelvis.  3. No bowel obstruction. Normal appendix.  4. Moderate bladder wall thickening is nonspecific and may be secondary to chronic outlet obstruction and/or cystitis. The prostate gland is normal in size.  5. Moderate lumbar spine degenerative change.  6. Mild fibrosis in the lung bases associated with traction chronic atelectasis.   10/30/01 Nuclear stress test [facility]): Impression: 1. Cardiolite images do not reveal evidence of ischemia or scar. There appears to be soft tissue/diaphragmatic attenuation over the inferior wall. 2. Gated study is normal with an ejection fraction of 76%. 3. Abnormal EKG stress test with approximately 1.75 mm inferior and lateral ST segment depression. 4. No chest pain developed. 5. Frequent PVCs and couplets were seen by peak exercise and occasional PVCs were seen in the immediate recovery in occasional PACs were seen during the recovery.  His  wife is present participates in evaluation and medical decision making He underwent coronary angiography in 2003 I cannot see the report we will try to access a lot of old medical records but he tells me he had mild nonobstructive CAD in 2 of his coronary arteries He has been intolerant of statin a atorvastatin has not been on lipid-lowering therapy He has had no angina edema palpitation or syncope. He follows with pulmonary in [place] and has symptomatic pulmonary fibrosis he worked in a  [employer] and also had exposure to asbestos. He short of breath but still does have a garden work notices a diminished exercise tolerance but thinks he is improved with steroid therapy  Recent outpatient labs 02/24/2020 cholesterol 183 LDL 123 triglycerides 68 HDL 47.  Past Medical History:  Diagnosis Date   Arthritis    Complication of anesthesia    hard to awaken-blood pressure dropped   GERD (gastroesophageal reflux disease)    Glaucoma    Hypertension    Lung fibrosis ([place])    Myelolipoma of adrenal gland    Pneumonia    hx child   Pre-diabetes     Past Surgical History:  Procedure Laterality Date   BACK SURGERY     X 3   CARPAL TUNNEL RELEASE Bilateral    KNEE ARTHROPLASTY Right 01/2016   LUMBAR LAMINECTOMY/DECOMPRESSION MICRODISCECTOMY N/A 04/28/2016   Procedure: Lumbar two-Sacral one Laminectomy;  Surgeon: Kevan Ny Ditty, MD;  Location: [facility];  Service: Neurosurgery;  Laterality: N/A;   LUMBAR WOUND DEBRIDEMENT N/A 05/30/2016   Procedure: Exploration of Lumbar Wound and Repair of Cerebrospinal Fluid Fistula;  Surgeon: Kevan Ny Ditty, MD;  Location: [facility];  Service: Neurosurgery;  Laterality: N/A;    Current Medications: Current Meds  Medication Sig   acetaminophen (TYLENOL) 650 MG CR tablet Take 650-1,300 mg by mouth every 8 (eight) hours as needed for pain.    amLODipine (NORVASC) 5 MG tablet Take 5 mg by mouth daily.   APPLE CIDER VINEGAR PO Take 15 mLs by mouth 2 (two) times a week.    aspirin EC 81 MG tablet Take 81 mg by mouth daily.   benzonatate (TESSALON) 100 MG capsule Take 100 mg by mouth 2 (two) times daily.   loratadine (CLARITIN) 10 MG tablet Take 10 mg by mouth 2 (two) times daily.   losartan-hydrochlorothiazide (HYZAAR) 50-12.5 MG tablet Take 1 tablet by mouth daily.   Misc Natural Products (GLUCOSAMINE CHOND COMPLEX/MSM PO) Take 1 tablet by mouth daily.   Multiple Vitamin (MULTIVITAMIN WITH MINERALS) TABS tablet Take 1 tablet by mouth  every other day.    omeprazole (PRILOSEC) 40 MG capsule Take 40 mg by mouth every 12 (twelve) hours.   polycarbophil (FIBERCON) 625 MG tablet Take 625 mg by mouth daily.   pravastatin (PRAVACHOL) 20 MG tablet Take 1 tablet (20 mg total) by mouth 3 (three) times a week.   predniSONE (DELTASONE) 10 MG tablet Take 10 mg by mouth daily with breakfast.   tamsulosin (FLOMAX) 0.4 MG CAPS capsule Take 0.4 mg by mouth 2 (two) times daily.   TRELEGY ELLIPTA 100-62.5-25 MCG/ACT AEPB Take 1 puff by mouth every morning.   Turmeric Curcumin 500 MG CAPS Take 500 mg by mouth daily.     Allergies:   No known allergies   Social History   Socioeconomic History   Marital status: Married    Spouse name: ELIZABETH   Number of children: 2   Years of education: 12   Highest  education level: Not on file  Occupational History   Occupation: RETIRED [employer] / [employer]  Tobacco Use   Smoking status: Former    Packs/day: 1.50    Years: 15.00    Pack years: 22.50    Types: Cigarettes    Quit date: 04/19/1996    Years since quitting: 25.0    Passive exposure: Past   Smokeless tobacco: Never  Vaping Use   Vaping Use: Never used  Substance and Sexual Activity   Alcohol use: No   Drug use: No   Sexual activity: Yes  Other Topics Concern   Not on file  Social History Narrative   Not on file   Social Determinants of Health   Financial Resource Strain: Not on file  Food Insecurity: Not on file  Transportation Needs: Not on file  Physical Activity: Not on file  Stress: Not on file  Social Connections: Not on file     Family History: The patient's family history includes Diabetes in his mother; Hypertension in his mother; Lung cancer in his sister; Stroke in his father.  ROS:   ROS Please see the history of present illness.     All other systems reviewed and are negative.  EKGs/Labs/Other Studies Reviewed:    The following studies were reviewed today:   EKG:  EKG is  ordered today.  The ekg  ordered today is personally reviewed and demonstrates sinus rhythm left axis deviation left anterior hemiblock first-degree AV block consider apical MI  Recent Labs: See history  Physical Exam:    VS:  BP 130/72    Pulse 73    Ht 6\' 3"  (1.905 m)    Wt 258 lb 1.9 oz (117.1 kg)    SpO2 98%    BMI 32.26 kg/m     Wt Readings from Last 3 Encounters:  04/15/21 258 lb 1.9 oz (117.1 kg)  04/05/21 257 lb 9.6 oz (116.8 kg)  05/31/16 255 lb (115.7 kg)     GEN: He does not look chronically ill or debilitate well nourished, well developed in no acute distress HEENT: Normal NECK: No JVD; No carotid bruits LYMPHATICS: No lymphadenopathy CARDIAC: RRR, no murmurs, rubs, gallops RESPIRATORY: He has a few fibrotic rales right greater than left base otherwise clear chest ABDOMEN: Soft, non-tender, non-distended MUSCULOSKELETAL:  No edema; No deformity  SKIN: Warm and dry NEUROLOGIC:  Alert and oriented x 3 PSYCHIATRIC:  Normal affect     Signed, Shirlee More, MD  04/15/2021 10:36 AM    [place]

## 2021-04-18 ENCOUNTER — Telehealth: Payer: Self-pay | Admitting: Oncology

## 2021-04-18 NOTE — Telephone Encounter (Signed)
Per 12/6 LOS, patient care returned to referring Provider

## 2021-04-20 ENCOUNTER — Ambulatory Visit
Admission: RE | Admit: 2021-04-20 | Discharge: 2021-04-20 | Disposition: A | Discharge status: Home / Self Care | Payer: Self-pay | Source: Ambulatory Visit | Attending: Surgery | Admitting: Surgery

## 2021-04-20 ENCOUNTER — Other Ambulatory Visit: Payer: Self-pay

## 2021-04-20 DIAGNOSIS — E278 Other specified disorders of adrenal gland: Secondary | ICD-10-CM

## 2021-05-04 ENCOUNTER — Other Ambulatory Visit: Payer: Self-pay

## 2021-05-04 ENCOUNTER — Ambulatory Visit (INDEPENDENT_AMBULATORY_CARE_PROVIDER_SITE_OTHER): Payer: Medicare PPO

## 2021-05-04 DIAGNOSIS — R9431 Abnormal electrocardiogram [ECG] [EKG]: Secondary | ICD-10-CM

## 2021-05-04 DIAGNOSIS — R0602 Shortness of breath: Secondary | ICD-10-CM | POA: Diagnosis not present

## 2021-05-04 LAB — ECHOCARDIOGRAM COMPLETE
Area-P 1/2: 4.54 cm2
P 1/2 time: 533 msec
S' Lateral: 3.7 cm

## 2021-05-04 NOTE — Progress Notes (Signed)
The proposed treatment discussed in conference is for discussion purpose only and is not a binding recommendation.  The patients have not been physically examined, or presented with their treatment options.  Therefore, final treatment plans cannot be decided.  

## 2021-05-05 ENCOUNTER — Telehealth: Payer: Self-pay

## 2021-05-05 DIAGNOSIS — N400 Enlarged prostate without lower urinary tract symptoms: Secondary | ICD-10-CM | POA: Diagnosis not present

## 2021-05-05 DIAGNOSIS — Z87891 Personal history of nicotine dependence: Secondary | ICD-10-CM | POA: Diagnosis not present

## 2021-05-05 DIAGNOSIS — N529 Male erectile dysfunction, unspecified: Secondary | ICD-10-CM | POA: Diagnosis not present

## 2021-05-05 DIAGNOSIS — Z6832 Body mass index (BMI) 32.0-32.9, adult: Secondary | ICD-10-CM | POA: Diagnosis not present

## 2021-05-05 DIAGNOSIS — I10 Essential (primary) hypertension: Secondary | ICD-10-CM | POA: Diagnosis not present

## 2021-05-05 DIAGNOSIS — E669 Obesity, unspecified: Secondary | ICD-10-CM | POA: Diagnosis not present

## 2021-05-05 DIAGNOSIS — H409 Unspecified glaucoma: Secondary | ICD-10-CM | POA: Diagnosis not present

## 2021-05-05 DIAGNOSIS — Z7952 Long term (current) use of systemic steroids: Secondary | ICD-10-CM | POA: Diagnosis not present

## 2021-05-05 DIAGNOSIS — M1990 Unspecified osteoarthritis, unspecified site: Secondary | ICD-10-CM | POA: Diagnosis not present

## 2021-05-05 NOTE — Telephone Encounter (Signed)
Spoke with patient regarding results and recommendation.  Patient verbalizes understanding and is agreeable to plan of care. Advised patient to call back with any issues or concerns.  

## 2021-05-05 NOTE — Telephone Encounter (Signed)
-----   Message from Richardo Priest, MD sent at 05/05/2021  8:17 AM EST ----- Good result his echocardiogram is normal

## 2021-05-25 DIAGNOSIS — J329 Chronic sinusitis, unspecified: Secondary | ICD-10-CM | POA: Diagnosis not present

## 2021-05-25 DIAGNOSIS — N390 Urinary tract infection, site not specified: Secondary | ICD-10-CM | POA: Diagnosis not present

## 2021-05-25 DIAGNOSIS — Z79899 Other long term (current) drug therapy: Secondary | ICD-10-CM | POA: Diagnosis not present

## 2021-05-25 DIAGNOSIS — I10 Essential (primary) hypertension: Secondary | ICD-10-CM | POA: Diagnosis not present

## 2021-05-25 DIAGNOSIS — Z6833 Body mass index (BMI) 33.0-33.9, adult: Secondary | ICD-10-CM | POA: Diagnosis not present

## 2021-05-26 ENCOUNTER — Ambulatory Visit: Payer: Medicare PPO | Admitting: Cardiology

## 2021-05-26 ENCOUNTER — Other Ambulatory Visit: Payer: Self-pay

## 2021-05-26 ENCOUNTER — Encounter: Payer: Self-pay | Admitting: Cardiology

## 2021-05-26 VITALS — BP 144/92 | HR 72 | Ht 75.0 in | Wt 265.0 lb

## 2021-05-26 DIAGNOSIS — I10 Essential (primary) hypertension: Secondary | ICD-10-CM | POA: Diagnosis not present

## 2021-05-26 DIAGNOSIS — I2510 Atherosclerotic heart disease of native coronary artery without angina pectoris: Secondary | ICD-10-CM | POA: Diagnosis not present

## 2021-05-26 DIAGNOSIS — E782 Mixed hyperlipidemia: Secondary | ICD-10-CM | POA: Diagnosis not present

## 2021-05-26 DIAGNOSIS — J8410 Pulmonary fibrosis, unspecified: Secondary | ICD-10-CM

## 2021-05-26 MED ORDER — EZETIMIBE 10 MG PO TABS: 10.0000 mg | ORAL_TABLET | Freq: Every day | ORAL | 3 refills | Status: AC

## 2021-05-26 NOTE — Patient Instructions (Signed)
Medication Instructions:  Your physician has recommended you make the following change in your medication:  START: Zetia 10 mg take one tablet by mouth daily.   *If you need a refill on your cardiac medications before your next appointment, please call your pharmacy*   Lab Work: Your physician recommends that you return for lab work in: 6 weeks [place] If you have labs (blood work) drawn today and your tests are completely normal, you will receive your results only by: [place] (if you have MyChart) OR A paper copy in the mail If you have any lab test that is abnormal or we need to change your treatment, we will call you to review the results.   Testing/Procedures: Your physician has requested that you have a carotid duplex. This test is an ultrasound of the carotid arteries in your neck. It looks at blood flow through these arteries that supply the brain with blood. Allow one hour for this exam. There are no restrictions or special instructions.      Follow-Up: At [facility], you and your health needs are our priority.  As part of our continuing mission to provide you with exceptional heart care, we have created designated Provider Care Teams.  These Care Teams include your primary Cardiologist (physician) and Advanced Practice Providers (APPs -  Physician Assistants and Nurse Practitioners) who all work together to provide you with the care you need, when you need it.  We recommend signing up for the patient portal called "MyChart".  Sign up information is provided on this After Visit Summary.  MyChart is used to connect with patients for Virtual Visits (Telemedicine).  Patients are able to view lab/test results, encounter notes, upcoming appointments, etc.  Non-urgent messages can be sent to your provider as well.   To learn more about what you can do with MyChart, go to [email or web address].    Your next appointment:   6 month(s)  The format for your next appointment:    In Person  Provider:   Shirlee More, MD    Other Instructions

## 2021-05-26 NOTE — Progress Notes (Signed)
Cardiology Office Note:    Date:  05/26/2021   ID:  Alex, Berry December 17, 1955, MRN [id number]  PCP:  Earlyne Iba, NP  Cardiologist:  Shirlee More, MD    Referring MD: Earlyne Iba, NP    ASSESSMENT:    1. Coronary artery disease without angina pectoris, unspecified vessel or lesion type, unspecified whether native or transplanted heart   2. Mixed hyperlipidemia   3. Primary hypertension   4. Pulmonary fibrosis ([place])    PLAN:    In order of problems listed above:  Although he has mild nonobstructive CAD I do not think his exertional shortness of breath is anginal equivalent would continue his medical treatment including aspirin antihypertensives minimum dose of pravastatin and add Zetia to achieve target.  He will need a follow-up lipid profile in about 6 weeks. Stable hypertension continue treatment ARB thiazide diuretic calcium channel blocker Due for follow-up with his pulmonary physician I asked him to discuss with her about cardiac and/or pulmonary rehab I directed him back to his PCP about carotids we have no records.  Next appointment: 6 months   Medication Adjustments/Labs and Tests Ordered: Current medicines are reviewed at length with the patient today.  Concerns regarding medicines are outlined above.  Orders Placed This Encounter  Procedures   Lipid panel   Meds ordered this encounter  Medications   ezetimibe (ZETIA) 10 MG tablet    Sig: Take 1 tablet (10 mg total) by mouth daily.    Dispense:  90 tablet    Refill:  3    Chief Complaint  Patient presents with   Follow-up   Coronary Artery Disease   Hyperlipidemia    History of Present Illness:    Alex Berry is a 66 y.o. male with a hx of mild nonobstructive CAD hypertension hyperlipidemia and pulmonary fibrosis managed [facility] specialist [facility] last seen 04/15/2021 and placed on low intensity pravastatin 3 days a week.  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  Compliance with diet, lifestyle and medications: Yes  An echocardiogram performed 05/04/2021 left ventricle normal in size wall thickness systolic function normal EF 60 to 65% and grade 1 diastolic dysfunction.  Right ventricle normal size function and normal pulmonary artery pressure.  Mild aortic regurgitation was seen.  Imaging reviewed include CT of the abdomen and pelvis and myocardial perfusion study. CT abdomen/ pelvis 03/09/2021: 1. 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 Normal right adrenal gland.  2. No acute inflammatory process in the abdomen or pelvis.  3. No bowel obstruction. Normal appendix.  4. Moderate bladder wall thickening is nonspecific and may be secondary to chronic outlet obstruction and/or cystitis. The prostate gland is normal in size.  5. Moderate lumbar spine degenerative change.  6. Mild fibrosis in the lung bases associated with traction chronic atelectasis.    10/30/01 Nuclear stress test [facility]): Impression: 1. Cardiolite images do not reveal evidence of ischemia or scar. There appears to be soft tissue/diaphragmatic attenuation over the inferior wall. 2. Gated study is normal with an ejection fraction of 76%. 3. Abnormal EKG stress test with approximately 1.75 mm inferior and lateral ST segment depression. 4. No chest pain developed. 5. Frequent PVCs and couplets were seen by peak exercise and occasional PVCs were seen in the immediate recovery in occasional PACs were seen during the recovery.   He continues to struggle with exertional shortness  of breath he can walk 5 minutes outdoors has to stop and rest to recover Recently sinus drainage and has had wheezing. Has an upcoming follow-up with pulmonary in [place]. Is not having orthopnea edema chest pain or syncope He is tolerating his low-dose statin LDL above target and will combined with Zetia do a follow-up lipid profile in  6 weeks I think that her shortness of breath is pulmonary in etiology I think you would benefit from rehab either cardiac or pulmonary to improve his exercise tolerance. I asked me about his carotids I have no records but he thinks they were done by his PCP in the past and they will discuss it with her Past Medical History:  Diagnosis Date   Arthritis    Complication of anesthesia    hard to awaken-blood pressure dropped   GERD (gastroesophageal reflux disease)    Glaucoma    Hypertension    Lung fibrosis ([place])    Myelolipoma of adrenal gland    Pneumonia    hx child   Pre-diabetes     Past Surgical History:  Procedure Laterality Date   BACK SURGERY     X 3   CARPAL TUNNEL RELEASE Bilateral    KNEE ARTHROPLASTY Right 01/2016   LUMBAR LAMINECTOMY/DECOMPRESSION MICRODISCECTOMY N/A 04/28/2016   Procedure: Lumbar two-Sacral one Laminectomy;  Surgeon: Kevan Ny Ditty, MD;  Location: [place];  Service: Neurosurgery;  Laterality: N/A;   LUMBAR WOUND DEBRIDEMENT N/A 05/30/2016   Procedure: Exploration of Lumbar Wound and Repair of Cerebrospinal Fluid Fistula;  Surgeon: Kevan Ny Ditty, MD;  Location: [place];  Service: Neurosurgery;  Laterality: N/A;    Current Medications: Current Meds  Medication Sig   acetaminophen (TYLENOL) 650 MG CR tablet Take 650-1,300 mg by mouth every 8 (eight) hours as needed for pain.    amLODipine (NORVASC) 5 MG tablet Take 5 mg by mouth daily.   APPLE CIDER VINEGAR PO Take 15 mLs by mouth 2 (two) times a week.    aspirin EC 81 MG tablet Take 81 mg by mouth daily.   azithromycin (ZITHROMAX) 250 MG tablet Take 250 mg by mouth daily.   benzonatate (TESSALON) 100 MG capsule Take 100 mg by mouth 2 (two) times daily.   ezetimibe (ZETIA) 10 MG tablet Take 1 tablet (10 mg total) by mouth daily.   loratadine (CLARITIN) 10 MG tablet Take 10 mg by mouth 2 (two) times daily.   losartan-hydrochlorothiazide (HYZAAR) 50-12.5 MG tablet Take 1 tablet by mouth  daily.   Misc Natural Products (GLUCOSAMINE CHOND COMPLEX/MSM PO) Take 1 tablet by mouth daily.   Multiple Vitamin (MULTIVITAMIN WITH MINERALS) TABS tablet Take 1 tablet by mouth every other day.    omeprazole (PRILOSEC) 40 MG capsule Take 40 mg by mouth every 12 (twelve) hours.   polycarbophil (FIBERCON) 625 MG tablet Take 625 mg by mouth daily.   pravastatin (PRAVACHOL) 20 MG tablet Take 1 tablet (20 mg total) by mouth 3 (three) times a week.   predniSONE (DELTASONE) 2.5 MG tablet Take 2.5 mg by mouth daily with breakfast.   tamsulosin (FLOMAX) 0.4 MG CAPS capsule Take 0.4 mg by mouth 2 (two) times daily.   TRELEGY ELLIPTA 100-62.5-25 MCG/ACT AEPB Take 1 puff by mouth every morning.   Turmeric Curcumin 500 MG CAPS Take 500 mg by mouth daily.     Allergies:   No known allergies   Social History   Socioeconomic History   Marital status: Married    Spouse name: Benjamine Mola  Number of children: 2   Years of education: 12   Highest education level: Not on file  Occupational History   Occupation: RETIRED [employer] / [employer]  Tobacco Use   Smoking status: Former    Packs/day: 1.50    Years: 15.00    Pack years: 22.50    Types: Cigarettes    Quit date: 04/19/1996    Years since quitting: 25.1    Passive exposure: Past   Smokeless tobacco: Never  Vaping Use   Vaping Use: Never used  Substance and Sexual Activity   Alcohol use: No   Drug use: No   Sexual activity: Yes  Other Topics Concern   Not on file  Social History Narrative   Not on file   Social Determinants of Health   Financial Resource Strain: Not on file  Food Insecurity: Not on file  Transportation Needs: Not on file  Physical Activity: Not on file  Stress: Not on file  Social Connections: Not on file     Family History: The patient's family history includes Diabetes in his mother; Hypertension in his mother; Lung cancer in his sister; Stroke in his father. ROS:   Please see the history of present illness.     All other systems reviewed and are negative.  EKGs/Labs/Other Studies Reviewed:    The following studies were reviewed today:    Recent Labs: 04/13/2021 cholesterol 211 LDL 123 HDL 74 triglycerides 78 A1c 6.1%  Physical Exam:    VS:  BP (!) 144/92 (BP Location: Left Arm)    Pulse 72    Ht 6\' 3"  (1.905 m)    Wt 265 lb (120.2 kg)    SpO2 96%    BMI 33.12 kg/m     Wt Readings from Last 3 Encounters:  05/26/21 265 lb (120.2 kg)  04/15/21 258 lb 1.9 oz (117.1 kg)  04/05/21 257 lb 9.6 oz (116.8 kg)     GEN:  Well nourished, well developed in no acute distress HEENT: Normal NECK: No JVD; No carotid bruits LYMPHATICS: No lymphadenopathy CARDIAC: RRR, no murmurs, rubs, gallops RESPIRATORY:  Clear to auscultation without rales, wheezing or rhonchi  ABDOMEN: Soft, non-tender, non-distended MUSCULOSKELETAL:  No edema; No deformity  SKIN: Warm and dry NEUROLOGIC:  Alert and oriented x 3 PSYCHIATRIC:  Normal affect    Signed, Shirlee More, MD  05/26/2021 10:59 AM    [place]

## 2021-06-21 DIAGNOSIS — Z79899 Other long term (current) drug therapy: Secondary | ICD-10-CM | POA: Diagnosis not present

## 2021-06-21 DIAGNOSIS — J849 Interstitial pulmonary disease, unspecified: Secondary | ICD-10-CM | POA: Diagnosis not present

## 2021-06-21 DIAGNOSIS — G4733 Obstructive sleep apnea (adult) (pediatric): Secondary | ICD-10-CM | POA: Diagnosis not present

## 2021-06-21 DIAGNOSIS — R0609 Other forms of dyspnea: Secondary | ICD-10-CM | POA: Diagnosis not present

## 2021-06-29 DIAGNOSIS — G4733 Obstructive sleep apnea (adult) (pediatric): Secondary | ICD-10-CM | POA: Diagnosis not present

## 2021-07-22 DIAGNOSIS — J849 Interstitial pulmonary disease, unspecified: Secondary | ICD-10-CM | POA: Diagnosis not present

## 2021-07-22 DIAGNOSIS — G4733 Obstructive sleep apnea (adult) (pediatric): Secondary | ICD-10-CM | POA: Diagnosis not present

## 2021-07-22 DIAGNOSIS — Z79899 Other long term (current) drug therapy: Secondary | ICD-10-CM | POA: Diagnosis not present

## 2021-07-22 DIAGNOSIS — R0981 Nasal congestion: Secondary | ICD-10-CM | POA: Diagnosis not present

## 2021-07-22 DIAGNOSIS — R5383 Other fatigue: Secondary | ICD-10-CM | POA: Diagnosis not present

## 2021-07-22 DIAGNOSIS — R0609 Other forms of dyspnea: Secondary | ICD-10-CM | POA: Diagnosis not present

## 2021-07-22 DIAGNOSIS — R059 Cough, unspecified: Secondary | ICD-10-CM | POA: Diagnosis not present

## 2021-07-27 DIAGNOSIS — Z79899 Other long term (current) drug therapy: Secondary | ICD-10-CM | POA: Diagnosis not present

## 2021-07-27 DIAGNOSIS — J941 Fibrothorax: Secondary | ICD-10-CM | POA: Diagnosis not present

## 2021-07-27 DIAGNOSIS — J8410 Pulmonary fibrosis, unspecified: Secondary | ICD-10-CM | POA: Diagnosis not present

## 2021-07-27 DIAGNOSIS — J479 Bronchiectasis, uncomplicated: Secondary | ICD-10-CM | POA: Diagnosis not present

## 2021-07-27 DIAGNOSIS — J849 Interstitial pulmonary disease, unspecified: Secondary | ICD-10-CM | POA: Diagnosis not present

## 2021-08-11 DIAGNOSIS — R058 Other specified cough: Secondary | ICD-10-CM | POA: Diagnosis not present

## 2021-08-12 DIAGNOSIS — G4733 Obstructive sleep apnea (adult) (pediatric): Secondary | ICD-10-CM | POA: Diagnosis not present

## 2021-08-22 DIAGNOSIS — R059 Cough, unspecified: Secondary | ICD-10-CM | POA: Diagnosis not present

## 2021-08-22 DIAGNOSIS — R0981 Nasal congestion: Secondary | ICD-10-CM | POA: Diagnosis not present

## 2021-08-24 ENCOUNTER — Telehealth: Payer: Self-pay | Admitting: Cardiology

## 2021-08-24 NOTE — Telephone Encounter (Signed)
Pt c/o medication issue: ? ?1. Name of Medication:  ?ezetimibe (ZETIA) 10 MG tablet ?pravastatin (PRAVACHOL) 20 MG tablet ? ?2. How are you currently taking this medication (dosage and times per day)?  ? ?3. Are you having a reaction (difficulty breathing--STAT)?  ? ?4. What is your medication issue? Patient wants to know why he is on two cholesterol medications.   ?

## 2021-08-24 NOTE — Telephone Encounter (Signed)
Called patient back about his message. Patient is wonder if both his Zetia and Pravastatin are for his cholesterol. Informed patient that these two medication work together to get his cholesterol lower, especially since he is unable to take his pravastatin daily. Patient verbalized understanding and had no other questions. ?

## 2021-09-01 DIAGNOSIS — H401131 Primary open-angle glaucoma, bilateral, mild stage: Secondary | ICD-10-CM | POA: Diagnosis not present

## 2021-09-07 DIAGNOSIS — R0981 Nasal congestion: Secondary | ICD-10-CM | POA: Diagnosis not present

## 2021-09-07 DIAGNOSIS — R5383 Other fatigue: Secondary | ICD-10-CM | POA: Diagnosis not present

## 2021-09-07 DIAGNOSIS — J9610 Chronic respiratory failure, unspecified whether with hypoxia or hypercapnia: Secondary | ICD-10-CM | POA: Diagnosis not present

## 2021-09-07 DIAGNOSIS — G4733 Obstructive sleep apnea (adult) (pediatric): Secondary | ICD-10-CM | POA: Diagnosis not present

## 2021-09-07 DIAGNOSIS — J849 Interstitial pulmonary disease, unspecified: Secondary | ICD-10-CM | POA: Diagnosis not present

## 2021-09-07 DIAGNOSIS — Z79899 Other long term (current) drug therapy: Secondary | ICD-10-CM | POA: Diagnosis not present

## 2021-09-12 DIAGNOSIS — E785 Hyperlipidemia, unspecified: Secondary | ICD-10-CM | POA: Diagnosis not present

## 2021-09-12 DIAGNOSIS — J849 Interstitial pulmonary disease, unspecified: Secondary | ICD-10-CM | POA: Diagnosis not present

## 2021-09-12 DIAGNOSIS — Z87440 Personal history of urinary (tract) infections: Secondary | ICD-10-CM | POA: Diagnosis not present

## 2021-09-12 DIAGNOSIS — R7303 Prediabetes: Secondary | ICD-10-CM | POA: Diagnosis not present

## 2021-09-12 DIAGNOSIS — E559 Vitamin D deficiency, unspecified: Secondary | ICD-10-CM | POA: Diagnosis not present

## 2021-09-12 DIAGNOSIS — Z862 Personal history of diseases of the blood and blood-forming organs and certain disorders involving the immune mechanism: Secondary | ICD-10-CM | POA: Diagnosis not present

## 2021-09-12 DIAGNOSIS — M2550 Pain in unspecified joint: Secondary | ICD-10-CM | POA: Diagnosis not present

## 2021-09-12 DIAGNOSIS — Z6832 Body mass index (BMI) 32.0-32.9, adult: Secondary | ICD-10-CM | POA: Diagnosis not present

## 2021-09-12 DIAGNOSIS — Z125 Encounter for screening for malignant neoplasm of prostate: Secondary | ICD-10-CM | POA: Diagnosis not present

## 2021-09-12 DIAGNOSIS — E538 Deficiency of other specified B group vitamins: Secondary | ICD-10-CM | POA: Diagnosis not present

## 2021-09-12 DIAGNOSIS — Z79899 Other long term (current) drug therapy: Secondary | ICD-10-CM | POA: Diagnosis not present

## 2021-09-12 DIAGNOSIS — D1779 Benign lipomatous neoplasm of other sites: Secondary | ICD-10-CM | POA: Diagnosis not present

## 2021-09-12 DIAGNOSIS — I10 Essential (primary) hypertension: Secondary | ICD-10-CM | POA: Diagnosis not present

## 2021-09-15 DIAGNOSIS — H401131 Primary open-angle glaucoma, bilateral, mild stage: Secondary | ICD-10-CM | POA: Diagnosis not present

## 2021-09-23 DIAGNOSIS — J324 Chronic pansinusitis: Secondary | ICD-10-CM | POA: Diagnosis not present

## 2021-09-23 DIAGNOSIS — J342 Deviated nasal septum: Secondary | ICD-10-CM | POA: Diagnosis not present

## 2021-09-23 DIAGNOSIS — J322 Chronic ethmoidal sinusitis: Secondary | ICD-10-CM | POA: Diagnosis not present

## 2021-09-23 DIAGNOSIS — Z9981 Dependence on supplemental oxygen: Secondary | ICD-10-CM | POA: Diagnosis not present

## 2021-09-23 DIAGNOSIS — J8410 Pulmonary fibrosis, unspecified: Secondary | ICD-10-CM | POA: Diagnosis not present

## 2021-09-23 DIAGNOSIS — J301 Allergic rhinitis due to pollen: Secondary | ICD-10-CM | POA: Diagnosis not present

## 2021-09-28 DIAGNOSIS — J939 Pneumothorax, unspecified: Secondary | ICD-10-CM | POA: Diagnosis not present

## 2021-09-28 DIAGNOSIS — T380X5A Adverse effect of glucocorticoids and synthetic analogues, initial encounter: Secondary | ICD-10-CM | POA: Diagnosis not present

## 2021-09-28 DIAGNOSIS — R7989 Other specified abnormal findings of blood chemistry: Secondary | ICD-10-CM | POA: Diagnosis not present

## 2021-09-28 DIAGNOSIS — Z950 Presence of cardiac pacemaker: Secondary | ICD-10-CM | POA: Diagnosis not present

## 2021-09-28 DIAGNOSIS — R1312 Dysphagia, oropharyngeal phase: Secondary | ICD-10-CM | POA: Diagnosis not present

## 2021-09-28 DIAGNOSIS — R001 Bradycardia, unspecified: Secondary | ICD-10-CM | POA: Diagnosis not present

## 2021-09-28 DIAGNOSIS — J95821 Acute postprocedural respiratory failure: Secondary | ICD-10-CM | POA: Diagnosis not present

## 2021-09-28 DIAGNOSIS — E8770 Fluid overload, unspecified: Secondary | ICD-10-CM | POA: Diagnosis not present

## 2021-09-28 DIAGNOSIS — T50905A Adverse effect of unspecified drugs, medicaments and biological substances, initial encounter: Secondary | ICD-10-CM | POA: Diagnosis not present

## 2021-09-28 DIAGNOSIS — D899 Disorder involving the immune mechanism, unspecified: Secondary | ICD-10-CM | POA: Diagnosis not present

## 2021-09-28 DIAGNOSIS — J3489 Other specified disorders of nose and nasal sinuses: Secondary | ICD-10-CM | POA: Diagnosis not present

## 2021-09-28 DIAGNOSIS — J849 Interstitial pulmonary disease, unspecified: Secondary | ICD-10-CM | POA: Diagnosis not present

## 2021-09-28 DIAGNOSIS — D849 Immunodeficiency, unspecified: Secondary | ICD-10-CM | POA: Diagnosis not present

## 2021-09-28 DIAGNOSIS — R404 Transient alteration of awareness: Secondary | ICD-10-CM | POA: Diagnosis not present

## 2021-09-28 DIAGNOSIS — J9600 Acute respiratory failure, unspecified whether with hypoxia or hypercapnia: Secondary | ICD-10-CM | POA: Diagnosis not present

## 2021-09-28 DIAGNOSIS — R739 Hyperglycemia, unspecified: Secondary | ICD-10-CM | POA: Diagnosis not present

## 2021-09-28 DIAGNOSIS — R1310 Dysphagia, unspecified: Secondary | ICD-10-CM | POA: Diagnosis not present

## 2021-09-28 DIAGNOSIS — Z9981 Dependence on supplemental oxygen: Secondary | ICD-10-CM | POA: Diagnosis not present

## 2021-09-28 DIAGNOSIS — R0789 Other chest pain: Secondary | ICD-10-CM | POA: Diagnosis not present

## 2021-09-28 DIAGNOSIS — E1165 Type 2 diabetes mellitus with hyperglycemia: Secondary | ICD-10-CM | POA: Diagnosis not present

## 2021-09-28 DIAGNOSIS — I9589 Other hypotension: Secondary | ICD-10-CM | POA: Diagnosis not present

## 2021-09-28 DIAGNOSIS — I97621 Postprocedural hematoma of a circulatory system organ or structure following other procedure: Secondary | ICD-10-CM | POA: Diagnosis not present

## 2021-09-28 DIAGNOSIS — Z789 Other specified health status: Secondary | ICD-10-CM | POA: Diagnosis not present

## 2021-09-28 DIAGNOSIS — K219 Gastro-esophageal reflux disease without esophagitis: Secondary | ICD-10-CM | POA: Diagnosis not present

## 2021-09-28 DIAGNOSIS — F419 Anxiety disorder, unspecified: Secondary | ICD-10-CM | POA: Diagnosis not present

## 2021-09-28 DIAGNOSIS — I951 Orthostatic hypotension: Secondary | ICD-10-CM | POA: Diagnosis not present

## 2021-09-28 DIAGNOSIS — R918 Other nonspecific abnormal finding of lung field: Secondary | ICD-10-CM | POA: Diagnosis not present

## 2021-09-28 DIAGNOSIS — I6521 Occlusion and stenosis of right carotid artery: Secondary | ICD-10-CM | POA: Diagnosis not present

## 2021-09-28 DIAGNOSIS — Z452 Encounter for adjustment and management of vascular access device: Secondary | ICD-10-CM | POA: Diagnosis not present

## 2021-09-28 DIAGNOSIS — Z792 Long term (current) use of antibiotics: Secondary | ICD-10-CM | POA: Diagnosis not present

## 2021-09-28 DIAGNOSIS — I5030 Unspecified diastolic (congestive) heart failure: Secondary | ICD-10-CM | POA: Diagnosis not present

## 2021-09-28 DIAGNOSIS — R6521 Severe sepsis with septic shock: Secondary | ICD-10-CM | POA: Diagnosis not present

## 2021-09-28 DIAGNOSIS — Z7682 Awaiting organ transplant status: Secondary | ICD-10-CM | POA: Diagnosis not present

## 2021-09-28 DIAGNOSIS — Z9281 Personal history of extracorporeal membrane oxygenation (ECMO): Secondary | ICD-10-CM | POA: Diagnosis not present

## 2021-09-28 DIAGNOSIS — J984 Other disorders of lung: Secondary | ICD-10-CM | POA: Diagnosis not present

## 2021-09-28 DIAGNOSIS — K310 Acute dilatation of stomach: Secondary | ICD-10-CM | POA: Diagnosis not present

## 2021-09-28 DIAGNOSIS — J8410 Pulmonary fibrosis, unspecified: Secondary | ICD-10-CM | POA: Diagnosis not present

## 2021-09-28 DIAGNOSIS — R931 Abnormal findings on diagnostic imaging of heart and coronary circulation: Secondary | ICD-10-CM | POA: Diagnosis not present

## 2021-09-28 DIAGNOSIS — G4730 Sleep apnea, unspecified: Secondary | ICD-10-CM | POA: Diagnosis not present

## 2021-09-28 DIAGNOSIS — R0902 Hypoxemia: Secondary | ICD-10-CM | POA: Diagnosis not present

## 2021-09-28 DIAGNOSIS — I480 Paroxysmal atrial fibrillation: Secondary | ICD-10-CM | POA: Diagnosis not present

## 2021-09-28 DIAGNOSIS — Z949 Transplanted organ and tissue status, unspecified: Secondary | ICD-10-CM | POA: Diagnosis not present

## 2021-09-28 DIAGNOSIS — B3789 Other sites of candidiasis: Secondary | ICD-10-CM | POA: Diagnosis not present

## 2021-09-28 DIAGNOSIS — T86819 Unspecified complication of lung transplant: Secondary | ICD-10-CM | POA: Diagnosis not present

## 2021-09-28 DIAGNOSIS — T86818 Other complications of lung transplant: Secondary | ICD-10-CM | POA: Diagnosis not present

## 2021-09-28 DIAGNOSIS — D696 Thrombocytopenia, unspecified: Secondary | ICD-10-CM | POA: Diagnosis not present

## 2021-09-28 DIAGNOSIS — J9621 Acute and chronic respiratory failure with hypoxia: Secondary | ICD-10-CM | POA: Diagnosis not present

## 2021-09-28 DIAGNOSIS — J479 Bronchiectasis, uncomplicated: Secondary | ICD-10-CM | POA: Diagnosis not present

## 2021-09-28 DIAGNOSIS — T85618A Breakdown (mechanical) of other specified internal prosthetic devices, implants and grafts, initial encounter: Secondary | ICD-10-CM | POA: Diagnosis not present

## 2021-09-28 DIAGNOSIS — Z794 Long term (current) use of insulin: Secondary | ICD-10-CM | POA: Diagnosis not present

## 2021-09-28 DIAGNOSIS — K5989 Other specified functional intestinal disorders: Secondary | ICD-10-CM | POA: Diagnosis not present

## 2021-09-28 DIAGNOSIS — R5383 Other fatigue: Secondary | ICD-10-CM | POA: Diagnosis not present

## 2021-09-28 DIAGNOSIS — T86811 Lung transplant failure: Secondary | ICD-10-CM | POA: Diagnosis not present

## 2021-09-28 DIAGNOSIS — I4819 Other persistent atrial fibrillation: Secondary | ICD-10-CM | POA: Diagnosis not present

## 2021-09-28 DIAGNOSIS — R936 Abnormal findings on diagnostic imaging of limbs: Secondary | ICD-10-CM | POA: Diagnosis not present

## 2021-09-28 DIAGNOSIS — K5900 Constipation, unspecified: Secondary | ICD-10-CM | POA: Diagnosis not present

## 2021-09-28 DIAGNOSIS — R0489 Hemorrhage from other sites in respiratory passages: Secondary | ICD-10-CM | POA: Diagnosis not present

## 2021-09-28 DIAGNOSIS — I2510 Atherosclerotic heart disease of native coronary artery without angina pectoris: Secondary | ICD-10-CM | POA: Diagnosis not present

## 2021-09-28 DIAGNOSIS — G8918 Other acute postprocedural pain: Secondary | ICD-10-CM | POA: Diagnosis not present

## 2021-09-28 DIAGNOSIS — J948 Other specified pleural conditions: Secondary | ICD-10-CM | POA: Diagnosis not present

## 2021-09-28 DIAGNOSIS — I371 Nonrheumatic pulmonary valve insufficiency: Secondary | ICD-10-CM | POA: Diagnosis not present

## 2021-09-28 DIAGNOSIS — R7402 Elevation of levels of lactic acid dehydrogenase (LDH): Secondary | ICD-10-CM | POA: Diagnosis not present

## 2021-09-28 DIAGNOSIS — Z4682 Encounter for fitting and adjustment of non-vascular catheter: Secondary | ICD-10-CM | POA: Diagnosis not present

## 2021-09-28 DIAGNOSIS — K9423 Gastrostomy malfunction: Secondary | ICD-10-CM | POA: Diagnosis not present

## 2021-09-28 DIAGNOSIS — S20212A Contusion of left front wall of thorax, initial encounter: Secondary | ICD-10-CM | POA: Diagnosis not present

## 2021-09-28 DIAGNOSIS — R9389 Abnormal findings on diagnostic imaging of other specified body structures: Secondary | ICD-10-CM | POA: Diagnosis not present

## 2021-09-28 DIAGNOSIS — J811 Chronic pulmonary edema: Secondary | ICD-10-CM | POA: Diagnosis not present

## 2021-09-28 DIAGNOSIS — N170 Acute kidney failure with tubular necrosis: Secondary | ICD-10-CM | POA: Diagnosis not present

## 2021-09-28 DIAGNOSIS — Z4824 Encounter for aftercare following lung transplant: Secondary | ICD-10-CM | POA: Diagnosis not present

## 2021-09-28 DIAGNOSIS — D72829 Elevated white blood cell count, unspecified: Secondary | ICD-10-CM | POA: Diagnosis not present

## 2021-09-28 DIAGNOSIS — K6389 Other specified diseases of intestine: Secondary | ICD-10-CM | POA: Diagnosis not present

## 2021-09-28 DIAGNOSIS — J9622 Acute and chronic respiratory failure with hypercapnia: Secondary | ICD-10-CM | POA: Diagnosis not present

## 2021-09-28 DIAGNOSIS — J810 Acute pulmonary edema: Secondary | ICD-10-CM | POA: Diagnosis not present

## 2021-09-28 DIAGNOSIS — J189 Pneumonia, unspecified organism: Secondary | ICD-10-CM | POA: Diagnosis not present

## 2021-09-28 DIAGNOSIS — J9601 Acute respiratory failure with hypoxia: Secondary | ICD-10-CM | POA: Diagnosis not present

## 2021-09-28 DIAGNOSIS — H409 Unspecified glaucoma: Secondary | ICD-10-CM | POA: Diagnosis not present

## 2021-09-28 DIAGNOSIS — A419 Sepsis, unspecified organism: Secondary | ICD-10-CM | POA: Diagnosis not present

## 2021-09-28 DIAGNOSIS — K5939 Other megacolon: Secondary | ICD-10-CM | POA: Diagnosis not present

## 2021-09-28 DIAGNOSIS — B379 Candidiasis, unspecified: Secondary | ICD-10-CM | POA: Diagnosis not present

## 2021-09-28 DIAGNOSIS — I959 Hypotension, unspecified: Secondary | ICD-10-CM | POA: Diagnosis not present

## 2021-09-28 DIAGNOSIS — K567 Ileus, unspecified: Secondary | ICD-10-CM | POA: Diagnosis not present

## 2021-09-28 DIAGNOSIS — Z7901 Long term (current) use of anticoagulants: Secondary | ICD-10-CM | POA: Diagnosis not present

## 2021-09-28 DIAGNOSIS — I700 Atherosclerosis of aorta: Secondary | ICD-10-CM | POA: Diagnosis not present

## 2021-09-28 DIAGNOSIS — Z01818 Encounter for other preprocedural examination: Secondary | ICD-10-CM | POA: Diagnosis not present

## 2021-09-28 DIAGNOSIS — R000 Tachycardia, unspecified: Secondary | ICD-10-CM | POA: Diagnosis not present

## 2021-09-28 DIAGNOSIS — R059 Cough, unspecified: Secondary | ICD-10-CM | POA: Diagnosis not present

## 2021-09-28 DIAGNOSIS — J952 Acute pulmonary insufficiency following nonthoracic surgery: Secondary | ICD-10-CM | POA: Diagnosis not present

## 2021-09-28 DIAGNOSIS — K3189 Other diseases of stomach and duodenum: Secondary | ICD-10-CM | POA: Diagnosis not present

## 2021-09-28 DIAGNOSIS — J84112 Idiopathic pulmonary fibrosis: Secondary | ICD-10-CM | POA: Diagnosis not present

## 2021-09-28 DIAGNOSIS — R491 Aphonia: Secondary | ICD-10-CM | POA: Diagnosis not present

## 2021-09-28 DIAGNOSIS — I10 Essential (primary) hypertension: Secondary | ICD-10-CM | POA: Diagnosis not present

## 2021-09-28 DIAGNOSIS — D62 Acute posthemorrhagic anemia: Secondary | ICD-10-CM | POA: Diagnosis not present

## 2021-09-28 DIAGNOSIS — Z4931 Encounter for adequacy testing for hemodialysis: Secondary | ICD-10-CM | POA: Diagnosis not present

## 2021-09-28 DIAGNOSIS — J9811 Atelectasis: Secondary | ICD-10-CM | POA: Diagnosis not present

## 2021-09-28 DIAGNOSIS — D175 Benign lipomatous neoplasm of intra-abdominal organs: Secondary | ICD-10-CM | POA: Diagnosis not present

## 2021-09-28 DIAGNOSIS — R0600 Dyspnea, unspecified: Secondary | ICD-10-CM | POA: Diagnosis not present

## 2021-09-28 DIAGNOSIS — Z942 Lung transplant status: Secondary | ICD-10-CM | POA: Diagnosis not present

## 2021-09-28 DIAGNOSIS — J155 Pneumonia due to Escherichia coli: Secondary | ICD-10-CM | POA: Diagnosis not present

## 2021-09-28 DIAGNOSIS — Z5181 Encounter for therapeutic drug level monitoring: Secondary | ICD-10-CM | POA: Diagnosis not present

## 2021-09-28 DIAGNOSIS — Z9911 Dependence on respirator [ventilator] status: Secondary | ICD-10-CM | POA: Diagnosis not present

## 2021-09-28 DIAGNOSIS — Z930 Tracheostomy status: Secondary | ICD-10-CM | POA: Diagnosis not present

## 2021-09-28 DIAGNOSIS — J342 Deviated nasal septum: Secondary | ICD-10-CM | POA: Diagnosis not present

## 2021-09-28 DIAGNOSIS — I4891 Unspecified atrial fibrillation: Secondary | ICD-10-CM | POA: Diagnosis not present

## 2021-09-28 DIAGNOSIS — R0602 Shortness of breath: Secondary | ICD-10-CM | POA: Diagnosis not present

## 2021-09-28 DIAGNOSIS — J8489 Other specified interstitial pulmonary diseases: Secondary | ICD-10-CM | POA: Diagnosis not present

## 2021-09-28 DIAGNOSIS — Z978 Presence of other specified devices: Secondary | ICD-10-CM | POA: Diagnosis not present

## 2021-09-28 DIAGNOSIS — J90 Pleural effusion, not elsewhere classified: Secondary | ICD-10-CM | POA: Diagnosis not present

## 2021-09-28 DIAGNOSIS — N179 Acute kidney failure, unspecified: Secondary | ICD-10-CM | POA: Diagnosis not present

## 2021-09-28 DIAGNOSIS — Z9189 Other specified personal risk factors, not elsewhere classified: Secondary | ICD-10-CM | POA: Diagnosis not present

## 2021-09-28 DIAGNOSIS — D638 Anemia in other chronic diseases classified elsewhere: Secondary | ICD-10-CM | POA: Diagnosis not present

## 2021-09-28 DIAGNOSIS — K2289 Other specified disease of esophagus: Secondary | ICD-10-CM | POA: Diagnosis not present

## 2021-09-28 DIAGNOSIS — Z4659 Encounter for fitting and adjustment of other gastrointestinal appliance and device: Secondary | ICD-10-CM | POA: Diagnosis not present

## 2021-09-28 DIAGNOSIS — J9383 Other pneumothorax: Secondary | ICD-10-CM | POA: Diagnosis not present

## 2021-09-28 DIAGNOSIS — E43 Unspecified severe protein-calorie malnutrition: Secondary | ICD-10-CM | POA: Diagnosis not present

## 2021-09-28 DIAGNOSIS — Z9889 Other specified postprocedural states: Secondary | ICD-10-CM | POA: Diagnosis not present

## 2021-10-25 DIAGNOSIS — Z949 Transplanted organ and tissue status, unspecified: Secondary | ICD-10-CM | POA: Diagnosis not present

## 2021-11-20 NOTE — Progress Notes (Deleted)
Cardiology Office Note:    Date:  11/20/2021   ID:  Berry, Alex [birth year], MRN [id number]  PCP:  Earlyne Iba, NP  Cardiologist:  Shirlee More, MD    Referring MD: Earlyne Iba, NP    ASSESSMENT:    No diagnosis found. PLAN:    In order of problems listed above:  ***   Next appointment: ***   Medication Adjustments/Labs and Tests Ordered: Current medicines are reviewed at length with the patient today.  Concerns regarding medicines are outlined above.  No orders of the defined types were placed in this encounter.  No orders of the defined types were placed in this encounter.   No chief complaint on file.   History of Present Illness:    Alex Berry is a 66 y.o. male with a hx of mild nonobstructive CAD hypertension hyperlipidemia and pulmonary fibrosis last seen on 05/26/2021.  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 Compliance with diet, lifestyle and medications: *** Past Medical History:  Diagnosis Date   Arthritis    Complication of anesthesia    hard to awaken-blood pressure dropped   GERD (gastroesophageal reflux disease)    Glaucoma    Hypertension    Lung fibrosis ([facility])    Myelolipoma of adrenal gland    Pneumonia    hx child   Pre-diabetes     Past Surgical History:  Procedure Laterality Date   BACK SURGERY     X 3   CARPAL TUNNEL RELEASE Bilateral    KNEE ARTHROPLASTY Right 01/2016   LUMBAR LAMINECTOMY/DECOMPRESSION MICRODISCECTOMY N/A 04/28/2016   Procedure: Lumbar two-Sacral one Laminectomy;  Surgeon: Kevan Ny Ditty, MD;  Location: [place];  Service: Neurosurgery;  Laterality: N/A;   LUMBAR WOUND DEBRIDEMENT N/A 05/30/2016   Procedure: Exploration of Lumbar Wound and Repair of Cerebrospinal Fluid Fistula;  Surgeon: Kevan Ny Ditty, MD;  Location: [facility];  Service: Neurosurgery;  Laterality: N/A;    Current Medications: No outpatient medications have been marked as taking for the 11/21/21 encounter  (Appointment) with Richardo Priest, MD.     Allergies:   No known allergies   Social History   Socioeconomic History   Marital status: Married    Spouse name: ELIZABETH   Number of children: 2   Years of education: 12   Highest education level: Not on file  Occupational History   Occupation: RETIRED [employer] / [employer]  Tobacco Use   Smoking status: Former    Packs/day: 1.50    Years: 15.00    Total pack years: 22.50    Types: Cigarettes    Quit date: 04/19/1996    Years since quitting: 25.6    Passive exposure: Past   Smokeless tobacco: Never  Vaping Use   Vaping Use: Never used  Substance and Sexual Activity   Alcohol use: No   Drug use: No   Sexual activity: Yes  Other Topics Concern   Not on file  Social History Narrative   Not on file   Social Determinants of Health   Financial Resource Strain: Not on file  Food Insecurity: Not on file  Transportation Needs: Not on file  Physical Activity: Not on file  Stress: Not on file  Social Connections: Not on file     Family History: The patient's ***family history includes Diabetes in his mother; Hypertension in his mother; Lung cancer in his sister; Stroke in his father. ROS:   Please see the history of present illness.  All other systems reviewed and are negative.  EKGs/Labs/Other Studies Reviewed:    The following studies were reviewed today:  An echocardiogram performed 05/04/2021 left ventricle normal in size wall thickness systolic function normal EF 60 to 65% and grade 1 diastolic dysfunction.  Right ventricle normal size function and normal pulmonary artery pressure.  Mild aortic regurgitation was seen.   Imaging reviewed include CT of the abdomen and pelvis and myocardial perfusion study. CT abdomen/ pelvis 03/09/2021: 1. 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 Normal right adrenal gland.  2. No acute inflammatory process in the abdomen or pelvis.  3. No bowel obstruction. Normal appendix.  4. Moderate  bladder wall thickening is nonspecific and may be secondary to chronic outlet obstruction and/or cystitis. The prostate gland is normal in size.  5. Moderate lumbar spine degenerative change.  6. Mild fibrosis in the lung bases associated with traction chronic atelectasis.    10/30/01 Nuclear stress test [facility]): Impression: 1. Cardiolite images do not reveal evidence of ischemia or scar. There appears to be soft tissue/diaphragmatic attenuation over the inferior wall. 2. Gated study is normal with an ejection fraction of 76%. 3. Abnormal EKG stress test with approximately 1.75 mm inferior and lateral ST segment depression. 4. No chest pain developed. 5. Frequent PVCs and couplets were seen by peak exercise and occasional PVCs were seen in the immediate recovery in occasional PACs were seen during the recovery.  EKG:  EKG ordered today and personally reviewed.  The ekg ordered today demonstrates ***  Recent Labs: No results found for requested labs within last 365 days.  Recent Lipid Panel No results found for: "CHOL", "TRIG", "HDL", "CHOLHDL", "VLDL", "LDLCALC", "LDLDIRECT"  Physical Exam:    VS:  There were no vitals taken for this visit.    Wt Readings from Last 3 Encounters:  05/26/21 265 lb (120.2 kg)  04/15/21 258 lb 1.9 oz (117.1 kg)  04/05/21 257 lb 9.6 oz (116.8 kg)     GEN: *** Well nourished, well developed in no acute distress HEENT: Normal NECK: No JVD; No carotid bruits LYMPHATICS: No lymphadenopathy CARDIAC: ***RRR, no murmurs, rubs, gallops RESPIRATORY:  Clear to auscultation without rales, wheezing or rhonchi  ABDOMEN: Soft, non-tender, non-distended MUSCULOSKELETAL:  No edema; No deformity  SKIN: Warm and dry NEUROLOGIC:  Alert and oriented x 3 PSYCHIATRIC:  Normal affect    Signed, Shirlee More, MD  11/20/2021 12:58 PM    [facility]

## 2021-11-21 ENCOUNTER — Ambulatory Visit: Payer: Medicare PPO | Admitting: Cardiology

## 2021-11-29 DIAGNOSIS — Z949 Transplanted organ and tissue status, unspecified: Secondary | ICD-10-CM | POA: Diagnosis not present

## 2021-12-30 DIAGNOSIS — Z949 Transplanted organ and tissue status, unspecified: Secondary | ICD-10-CM | POA: Diagnosis not present

## 2022-01-29 DEATH — deceased
